# Patient Record
Sex: Male | Born: 1940 | Race: White | Hispanic: No | Smoking: Never smoker
Health system: Southern US, Community
[De-identification: ages and names within clinical notes are randomized; demographics above are authoritative.]

## PROBLEM LIST (undated history)

## (undated) DIAGNOSIS — I10 Essential (primary) hypertension: Secondary | ICD-10-CM

## (undated) DIAGNOSIS — M109 Gout, unspecified: Secondary | ICD-10-CM

## (undated) DIAGNOSIS — F418 Other specified anxiety disorders: Secondary | ICD-10-CM

## (undated) DIAGNOSIS — K219 Gastro-esophageal reflux disease without esophagitis: Secondary | ICD-10-CM

## (undated) DIAGNOSIS — C4490 Unspecified malignant neoplasm of skin, unspecified: Secondary | ICD-10-CM

## (undated) DIAGNOSIS — I2510 Atherosclerotic heart disease of native coronary artery without angina pectoris: Secondary | ICD-10-CM

## (undated) DIAGNOSIS — E7800 Pure hypercholesterolemia, unspecified: Secondary | ICD-10-CM

## (undated) DIAGNOSIS — R339 Retention of urine, unspecified: Secondary | ICD-10-CM

## (undated) HISTORY — PX: SKIN CANCER EXCISION: SHX779

## (undated) HISTORY — PX: CATARACT EXTRACTION: SUR2

## (undated) HISTORY — PX: LAPAROSCOPIC CHOLECYSTECTOMY: SUR755

---

## 2014-11-03 ENCOUNTER — Emergency Department (HOSPITAL_BASED_OUTPATIENT_CLINIC_OR_DEPARTMENT_OTHER): Payer: Medicare Other

## 2014-11-03 ENCOUNTER — Inpatient Hospital Stay (HOSPITAL_BASED_OUTPATIENT_CLINIC_OR_DEPARTMENT_OTHER)
Admission: EM | Admit: 2014-11-03 | Discharge: 2014-11-05 | DRG: 378 | Disposition: A | Discharge status: Home / Self Care | Payer: Medicare Other | Attending: Internal Medicine | Admitting: Internal Medicine

## 2014-11-03 ENCOUNTER — Encounter (HOSPITAL_BASED_OUTPATIENT_CLINIC_OR_DEPARTMENT_OTHER): Payer: Self-pay

## 2014-11-03 DIAGNOSIS — Z6824 Body mass index (BMI) 24.0-24.9, adult: Secondary | ICD-10-CM

## 2014-11-03 DIAGNOSIS — B952 Enterococcus as the cause of diseases classified elsewhere: Secondary | ICD-10-CM | POA: Diagnosis present

## 2014-11-03 DIAGNOSIS — N184 Chronic kidney disease, stage 4 (severe): Secondary | ICD-10-CM | POA: Diagnosis present

## 2014-11-03 DIAGNOSIS — K2970 Gastritis, unspecified, without bleeding: Secondary | ICD-10-CM | POA: Diagnosis present

## 2014-11-03 DIAGNOSIS — I2510 Atherosclerotic heart disease of native coronary artery without angina pectoris: Secondary | ICD-10-CM | POA: Diagnosis present

## 2014-11-03 DIAGNOSIS — D649 Anemia, unspecified: Secondary | ICD-10-CM | POA: Diagnosis present

## 2014-11-03 DIAGNOSIS — Z85828 Personal history of other malignant neoplasm of skin: Secondary | ICD-10-CM

## 2014-11-03 DIAGNOSIS — M109 Gout, unspecified: Secondary | ICD-10-CM | POA: Diagnosis present

## 2014-11-03 DIAGNOSIS — Z791 Long term (current) use of non-steroidal anti-inflammatories (NSAID): Secondary | ICD-10-CM

## 2014-11-03 DIAGNOSIS — N390 Urinary tract infection, site not specified: Secondary | ICD-10-CM | POA: Diagnosis present

## 2014-11-03 DIAGNOSIS — R32 Unspecified urinary incontinence: Secondary | ICD-10-CM | POA: Diagnosis present

## 2014-11-03 DIAGNOSIS — K921 Melena: Secondary | ICD-10-CM | POA: Diagnosis present

## 2014-11-03 DIAGNOSIS — N179 Acute kidney failure, unspecified: Secondary | ICD-10-CM | POA: Diagnosis present

## 2014-11-03 DIAGNOSIS — K254 Chronic or unspecified gastric ulcer with hemorrhage: Principal | ICD-10-CM | POA: Diagnosis present

## 2014-11-03 DIAGNOSIS — M545 Low back pain: Secondary | ICD-10-CM | POA: Diagnosis present

## 2014-11-03 DIAGNOSIS — F418 Other specified anxiety disorders: Secondary | ICD-10-CM | POA: Diagnosis present

## 2014-11-03 DIAGNOSIS — Z79899 Other long term (current) drug therapy: Secondary | ICD-10-CM | POA: Diagnosis not present

## 2014-11-03 DIAGNOSIS — K219 Gastro-esophageal reflux disease without esophagitis: Secondary | ICD-10-CM | POA: Diagnosis present

## 2014-11-03 DIAGNOSIS — M419 Scoliosis, unspecified: Secondary | ICD-10-CM | POA: Diagnosis present

## 2014-11-03 DIAGNOSIS — I129 Hypertensive chronic kidney disease with stage 1 through stage 4 chronic kidney disease, or unspecified chronic kidney disease: Secondary | ICD-10-CM | POA: Diagnosis present

## 2014-11-03 DIAGNOSIS — R339 Retention of urine, unspecified: Secondary | ICD-10-CM | POA: Diagnosis present

## 2014-11-03 DIAGNOSIS — K922 Gastrointestinal hemorrhage, unspecified: Secondary | ICD-10-CM

## 2014-11-03 DIAGNOSIS — M1990 Unspecified osteoarthritis, unspecified site: Secondary | ICD-10-CM | POA: Diagnosis present

## 2014-11-03 DIAGNOSIS — F0390 Unspecified dementia without behavioral disturbance: Secondary | ICD-10-CM | POA: Diagnosis present

## 2014-11-03 DIAGNOSIS — E780 Pure hypercholesterolemia: Secondary | ICD-10-CM | POA: Diagnosis present

## 2014-11-03 DIAGNOSIS — K5710 Diverticulosis of small intestine without perforation or abscess without bleeding: Secondary | ICD-10-CM | POA: Diagnosis present

## 2014-11-03 DIAGNOSIS — D62 Acute posthemorrhagic anemia: Secondary | ICD-10-CM | POA: Diagnosis not present

## 2014-11-03 DIAGNOSIS — R634 Abnormal weight loss: Secondary | ICD-10-CM | POA: Diagnosis present

## 2014-11-03 DIAGNOSIS — B9620 Unspecified Escherichia coli [E. coli] as the cause of diseases classified elsewhere: Secondary | ICD-10-CM | POA: Diagnosis present

## 2014-11-03 DIAGNOSIS — K5900 Constipation, unspecified: Secondary | ICD-10-CM | POA: Diagnosis present

## 2014-11-03 HISTORY — DX: Gastro-esophageal reflux disease without esophagitis: K21.9

## 2014-11-03 HISTORY — DX: Unspecified malignant neoplasm of skin, unspecified: C44.90

## 2014-11-03 HISTORY — DX: Other specified anxiety disorders: F41.8

## 2014-11-03 HISTORY — DX: Atherosclerotic heart disease of native coronary artery without angina pectoris: I25.10

## 2014-11-03 HISTORY — DX: Pure hypercholesterolemia, unspecified: E78.00

## 2014-11-03 HISTORY — DX: Gout, unspecified: M10.9

## 2014-11-03 HISTORY — DX: Retention of urine, unspecified: R33.9

## 2014-11-03 HISTORY — DX: Essential (primary) hypertension: I10

## 2014-11-03 LAB — CBC WITH DIFFERENTIAL/PLATELET
BAND NEUTROPHILS: 0 %
BASOS ABS: 0 10*3/uL (ref 0.0–0.1)
BLASTS: 0 %
Basophils Relative: 0 %
Eosinophils Absolute: 0.4 10*3/uL (ref 0.0–0.7)
Eosinophils Relative: 4 %
HEMATOCRIT: 38.7 % — AB (ref 39.0–52.0)
HEMOGLOBIN: 12.2 g/dL — AB (ref 13.0–17.0)
Lymphocytes Relative: 26 %
Lymphs Abs: 2.3 10*3/uL (ref 0.7–4.0)
MCH: 28.8 pg (ref 26.0–34.0)
MCHC: 31.5 g/dL (ref 30.0–36.0)
MCV: 91.5 fL (ref 78.0–100.0)
METAMYELOCYTES PCT: 1 %
Monocytes Absolute: 0.5 10*3/uL (ref 0.1–1.0)
Monocytes Relative: 6 %
Myelocytes: 0 %
Neutro Abs: 5.7 10*3/uL (ref 1.7–7.7)
Neutrophils Relative %: 63 %
PLATELETS: 241 10*3/uL (ref 150–400)
PROMYELOCYTES ABS: 0 %
RBC: 4.23 MIL/uL (ref 4.22–5.81)
RDW: 14.4 % (ref 11.5–15.5)
WBC: 8.9 10*3/uL (ref 4.0–10.5)
nRBC: 0 /100 WBC

## 2014-11-03 LAB — BASIC METABOLIC PANEL
ANION GAP: 10 (ref 5–15)
BUN: 44 mg/dL — AB (ref 6–20)
CALCIUM: 10 mg/dL (ref 8.9–10.3)
CO2: 26 mmol/L (ref 22–32)
Chloride: 105 mmol/L (ref 101–111)
Creatinine, Ser: 2.1 mg/dL — ABNORMAL HIGH (ref 0.61–1.24)
GFR calc Af Amer: 34 mL/min — ABNORMAL LOW (ref >60)
GFR, EST NON AFRICAN AMERICAN: 29 mL/min — AB (ref >60)
GLUCOSE: 101 mg/dL — AB (ref 65–99)
Potassium: 3.8 mmol/L (ref 3.5–5.1)
SODIUM: 141 mmol/L (ref 135–145)

## 2014-11-03 LAB — URINALYSIS, ROUTINE W REFLEX MICROSCOPIC
Bilirubin Urine: NEGATIVE
Glucose, UA: NEGATIVE mg/dL
KETONES UR: NEGATIVE mg/dL
Nitrite: NEGATIVE
PROTEIN: NEGATIVE mg/dL
Specific Gravity, Urine: 1.016 (ref 1.005–1.030)
UROBILINOGEN UA: 1 mg/dL (ref 0.0–1.0)
pH: 6 (ref 5.0–8.0)

## 2014-11-03 LAB — URINE MICROSCOPIC-ADD ON

## 2014-11-03 LAB — OCCULT BLOOD X 1 CARD TO LAB, STOOL: Fecal Occult Bld: POSITIVE — AB

## 2014-11-03 MED ORDER — DEXTROSE 5 % IV SOLN
1.0000 g | Freq: Once | INTRAVENOUS | Status: AC
  Administered 2014-11-03: 1 g via INTRAVENOUS

## 2014-11-03 MED ORDER — ATORVASTATIN CALCIUM 80 MG PO TABS
80.0000 mg | ORAL_TABLET | Freq: Every day | ORAL | Status: DC
  Administered 2014-11-04 – 2014-11-05 (×2): 80 mg via ORAL
  Filled 2014-11-03 (×2): qty 1

## 2014-11-03 MED ORDER — SODIUM CHLORIDE 0.9 % IV BOLUS (SEPSIS)
1000.0000 mL | Freq: Once | INTRAVENOUS | Status: AC
  Administered 2014-11-03: 1000 mL via INTRAVENOUS

## 2014-11-03 MED ORDER — PANTOPRAZOLE SODIUM 40 MG IV SOLR
40.0000 mg | INTRAVENOUS | Status: DC
  Administered 2014-11-03 – 2014-11-04 (×2): 40 mg via INTRAVENOUS
  Filled 2014-11-03 (×2): qty 40

## 2014-11-03 MED ORDER — SODIUM CHLORIDE 0.9 % IV SOLN
INTRAVENOUS | Status: DC
  Administered 2014-11-03: 75 mL/h via INTRAVENOUS
  Administered 2014-11-04 – 2014-11-05 (×2): via INTRAVENOUS

## 2014-11-03 MED ORDER — CEFTRIAXONE SODIUM 1 G IJ SOLR
INTRAMUSCULAR | Status: AC
  Filled 2014-11-03: qty 10

## 2014-11-03 MED ORDER — DEXTROSE 5 % IV SOLN
1.0000 g | INTRAVENOUS | Status: DC
  Administered 2014-11-04: 1 g via INTRAVENOUS
  Filled 2014-11-03 (×4): qty 10

## 2014-11-03 NOTE — ED Notes (Signed)
Pt transported to [facility] via [facility]

## 2014-11-03 NOTE — ED Notes (Signed)
Pt is calling brother in law to get medication list

## 2014-11-03 NOTE — ED Notes (Signed)
Pt last dr appt is unkown to patient, per patient, pt states he has been unable to walk without assistance x 1 year,  Reports blood in stool x 1 week, last colonoscopy x 2 years,

## 2014-11-03 NOTE — ED Notes (Signed)
Pt is poor historian-does not know list of meds or medical hx-states he goes to the [place]

## 2014-11-03 NOTE — ED Notes (Signed)
Patient transported to X-ray 

## 2014-11-03 NOTE — ED Notes (Signed)
Pt also self catheterizes hiself to relieve bladder,

## 2014-11-03 NOTE — ED Provider Notes (Signed)
CSN: [id number]     Arrival date & time 11/03/14  1336 History   First MD Initiated Contact with Patient 11/03/14 1506     Chief Complaint  Patient presents with  . Back Pain     Patient is a 74 y.o. male presenting with back pain. The history is provided by the patient.  Back Pain Location:  Lumbar spine Quality:  Aching Pain severity:  Moderate Onset quality:  Gradual Duration: "awhile" Timing:  Constant Progression:  Worsening Chronicity:  Chronic Relieved by:  Bed rest Worsened by:  Movement Associated symptoms: weakness   pt presents for multiple complaints He reports back pain for months and bilateral LE weakness This is not new today He uses walker at home for several months due to poor mobility  He also reports dark stools, unclear etiology (no pepto bismol reported)  He reports brief episodes of epigastric pain but none at this time  Also - he must self cath for urine for "awhile" per patient.  [facility]    Past Medical History  Diagnosis Date  . Skin cancer   . GERD (gastroesophageal reflux disease)   . Gout   . HTN (hypertension)   . Coronary artery disease   . Hypercholesterolemia   . Self-catheterizes urinary bladder   . Skin cancer   . Anxiety   . Depression    Past Surgical History  Procedure Laterality Date  . Skin cancer excision     No family history on file. Social History  Substance Use Topics  . Smoking status: Never Smoker   . Smokeless tobacco: None  . Alcohol Use: No    Review of Systems  Constitutional: Positive for fatigue.  Respiratory: Negative for shortness of breath.   Musculoskeletal: Positive for back pain.  Neurological: Positive for weakness.  All other systems reviewed and are negative.     Allergies  Review of patient's allergies indicates no known allergies.  Home Medications   Prior to Admission medications   Medication Sig Start Date End Date Taking? Authorizing Provider  allopurinol (ZYLOPRIM)  100 MG tablet Take 100 mg by mouth daily.   Yes Historical Provider, MD  atorvastatin (LIPITOR) 80 MG tablet Take 80 mg by mouth daily.   Yes Historical Provider, MD  benazepril-hydrochlorthiazide (LOTENSIN HCT) 10-12.5 MG per tablet Take 1 tablet by mouth daily.   Yes Historical Provider, MD  diazepam (VALIUM) 5 MG tablet Take 5 mg by mouth every 6 (six) hours as needed for anxiety.   Yes Historical Provider, MD  fenofibrate (TRICOR) 48 MG tablet Take 48 mg by mouth daily.   Yes Historical Provider, MD  ibuprofen (ADVIL,MOTRIN) 200 MG tablet Take 200 mg by mouth every 6 (six) hours as needed.   Yes Historical Provider, MD  Meloxicam 10 MG CAPS Take 50 mg by mouth.   Yes Historical Provider, MD  traZODone (DESYREL) 25 mg TABS tablet Take 50 mg by mouth at bedtime.   Yes Historical Provider, MD   BP 145/67 mmHg  Pulse 56  Temp(Src) 97.9 F (36.6 C) (Oral)  Resp 19  Ht 5\' 9"  (1.753 m)  Wt 163 lb (73.936 kg)  BMI 24.06 kg/m2  SpO2 96% Physical Exam CONSTITUTIONAL: Well developed/well nourished HEAD: Normocephalic/atraumatic EYES: EOMI/PERRL ENMT: Mucous membranes moist NECK: supple no meningeal signs SPINE/BACK:mild tenderness to lumbar spine, no bruising or stepoffs noted CV: S1/S2 noted, no murmurs/rubs/gallops noted LUNGS: Lungs are clear to auscultation bilaterally, no apparent distress ABDOMEN: soft, nontender, no  rebound or guarding, bowel sounds noted throughout abdomen Rectal - rectal tone appropriate, stool is dark, hemoccult positive, chaperone present GU:no cva tenderness NEURO: Pt is awake/alert/appropriate, moves all extremitiesx4.  No facial droop.  He has 4/5 strength with bilateral hip flexion EXTREMITIES: pulses normal/equal, full ROM SKIN: warm, color normal PSYCH: no abnormalities of mood noted, alert and oriented to situation  ED Course  Procedures  4:16 PM  Pt poor historian Pt here for multiple complaints including BP/weakness for months as well as dark  stools Labs/imaging ordered 4:54 PM Pt with multiple issues: appears to have early GI bleed, UTI and dehydration He is also feeling weaker and having mobility issues I feel he would benefit from admission Pt/family agreeable His biggest concerned today was darkened stool which was noted on exam  6:00 PM D/w dr Dyann Kief Will admit to [facility] Pt stable at this time  Labs Review Labs Reviewed  URINALYSIS, [place] (NOT AT [facility]) - Abnormal; Notable for the following:    APPearance TURBID (*)    Hgb urine dipstick SMALL (*)    Leukocytes, UA LARGE (*)    All other components within normal limits  BASIC METABOLIC PANEL - Abnormal; Notable for the following:    Glucose, Bld 101 (*)    BUN 44 (*)    Creatinine, Ser 2.10 (*)    GFR calc non Af Amer 29 (*)    GFR calc Af Amer 34 (*)    All other components within normal limits  CBC WITH DIFFERENTIAL/PLATELET - Abnormal; Notable for the following:    Hemoglobin 12.2 (*)    HCT 38.7 (*)    All other components within normal limits  OCCULT BLOOD X 1 CARD TO LAB, STOOL - Abnormal; Notable for the following:    Fecal Occult Bld POSITIVE (*)    All other components within normal limits  URINE MICROSCOPIC-ADD ON - Abnormal; Notable for the following:    Bacteria, UA MANY (*)    All other components within normal limits  URINE CULTURE    Imaging Review Dg Lumbar Spine Complete  11/03/2014   CLINICAL DATA:  Lumbago with radicular symptoms bilaterally. No recent trauma.  EXAM: LUMBAR SPINE - COMPLETE 4+ VIEW  COMPARISON:  None.  FINDINGS: Frontal, lateral, spot lumbosacral lateral, and bilateral oblique views were obtained. There are 5 non-rib-bearing lumbar type vertebral bodies. There is lower lumbar levoscoliosis. There is no fracture or spondylolisthesis. There is moderately severe disc space narrowing at L4-5. There is slightly less narrowing at T12-L1, L3-4, and L5-S1. There is facet osteoarthritic change at L3-4,  L4-5, and L5-S1 bilaterally. There is atherosclerotic calcification in the aorta.  IMPRESSION: Multilevel osteoarthritic change. Scoliosis. No fracture or spondylolisthesis.   Electronically Signed   By: Lowella Grip III M.D.   On: 11/03/2014 16:15   I have personally reviewed and evaluated these images and lab results as part of my medical decision-making.   EKG Interpretation   Date/Time:  Thursday November 03 2014 15:23:32 EDT Ventricular Rate:  47 PR Interval:  212 QRS Duration: 106 QT Interval:  446 QTC Calculation: 394 R Axis:   -9 Text Interpretation:  Sinus bradycardia with 1st degree A-V block  Incomplete right bundle branch block Cannot rule out Anterior infarct ,  age undetermined Abnormal ECG No previous ECGs available Confirmed by  Christy Gentles  MD, Brooklynne Pereida ([id number]) on 11/03/2014 3:32:36 PM     Medications  cefTRIAXone (ROCEPHIN) 1 G injection (not administered)  pantoprazole (  PROTONIX) injection 40 mg (not administered)  0.9 %  sodium chloride infusion (not administered)  cefTRIAXone (ROCEPHIN) 1 g in dextrose 5 % 50 mL IVPB (1 g Intravenous New Bag/Given 11/03/14 1703)  sodium chloride 0.9 % bolus 1,000 mL (1,000 mLs Intravenous New Bag/Given 11/03/14 1702)    MDM   Final diagnoses:  UTI (lower urinary tract infection)  Gastrointestinal hemorrhage, unspecified gastritis, unspecified gastrointestinal hemorrhage type  AKI (acute kidney injury)    Nursing notes including past medical history and social history reviewed and considered in documentation Labs/vital reviewed myself and considered during evaluation xrays/imaging reviewed by myself and considered during evaluation     [facility] Fraise, MD 11/03/14 1800

## 2014-11-03 NOTE — Progress Notes (Signed)
Patient with general weakness, black stools, positive FOBT and UA suggesting UTI. No fever, no SOB and no CP. Patient also found with AKI (unkown if acute or acute on chronic). Patient hemodynamically stable and with normal electrolytes.  Urine cx asked to be taken, rocephin initiated and started on IVF's. IV protonix, type and screen, 2 large IV's and CLD ordered. Patient accepted to Med surg.  Hector Finley [id number]

## 2014-11-03 NOTE — Progress Notes (Signed)
Pt arrived from [facility] ER in [place] via stretcher by [facility]. Pt alert and oriented x4. Denies any pain. Oriented to room. Call light within reach. Will continue to monitor.

## 2014-11-03 NOTE — ED Notes (Signed)
C/o lower back pain, decreased mobility to bilat LE x 4-5 months-also c/o dark stools x 4-5 months-states he has not been seen by doctor-pt's son brought pt in-states he has been walker at home x 3 months per Dr Sherral Hammers at [facility]

## 2014-11-04 ENCOUNTER — Inpatient Hospital Stay (HOSPITAL_COMMUNITY): Payer: Medicare Other | Admitting: Anesthesiology

## 2014-11-04 ENCOUNTER — Encounter (HOSPITAL_COMMUNITY): Payer: Self-pay | Admitting: Physician Assistant

## 2014-11-04 ENCOUNTER — Encounter (HOSPITAL_COMMUNITY)
Admission: EM | Disposition: A | Discharge status: Home / Self Care | Payer: Self-pay | Source: Home / Self Care | Attending: Internal Medicine

## 2014-11-04 DIAGNOSIS — N390 Urinary tract infection, site not specified: Secondary | ICD-10-CM

## 2014-11-04 DIAGNOSIS — N184 Chronic kidney disease, stage 4 (severe): Secondary | ICD-10-CM

## 2014-11-04 DIAGNOSIS — N179 Acute kidney failure, unspecified: Secondary | ICD-10-CM

## 2014-11-04 DIAGNOSIS — D62 Acute posthemorrhagic anemia: Secondary | ICD-10-CM

## 2014-11-04 HISTORY — PX: ESOPHAGOGASTRODUODENOSCOPY: SHX5428

## 2014-11-04 LAB — CBC
HEMATOCRIT: 33.4 % — AB (ref 39.0–52.0)
Hemoglobin: 10.8 g/dL — ABNORMAL LOW (ref 13.0–17.0)
MCH: 29.3 pg (ref 26.0–34.0)
MCHC: 32.3 g/dL (ref 30.0–36.0)
MCV: 90.8 fL (ref 78.0–100.0)
Platelets: 210 10*3/uL (ref 150–400)
RBC: 3.68 MIL/uL — ABNORMAL LOW (ref 4.22–5.81)
RDW: 14.4 % (ref 11.5–15.5)
WBC: 9.5 10*3/uL (ref 4.0–10.5)

## 2014-11-04 LAB — SODIUM, URINE, RANDOM: Sodium, Ur: 142 mmol/L

## 2014-11-04 LAB — BASIC METABOLIC PANEL
Anion gap: 7 (ref 5–15)
BUN: 34 mg/dL — AB (ref 6–20)
CALCIUM: 9.1 mg/dL (ref 8.9–10.3)
CO2: 26 mmol/L (ref 22–32)
Chloride: 108 mmol/L (ref 101–111)
Creatinine, Ser: 1.74 mg/dL — ABNORMAL HIGH (ref 0.61–1.24)
GFR calc Af Amer: 42 mL/min — ABNORMAL LOW (ref >60)
GFR, EST NON AFRICAN AMERICAN: 37 mL/min — AB (ref >60)
GLUCOSE: 101 mg/dL — AB (ref 65–99)
POTASSIUM: 3.7 mmol/L (ref 3.5–5.1)
Sodium: 141 mmol/L (ref 135–145)

## 2014-11-04 LAB — HEMOGLOBIN AND HEMATOCRIT, BLOOD
HEMATOCRIT: 38.4 % — AB (ref 39.0–52.0)
HEMATOCRIT: 34.5 % — AB (ref 39.0–52.0)
HEMOGLOBIN: 12.4 g/dL — AB (ref 13.0–17.0)
HEMOGLOBIN: 11.1 g/dL — AB (ref 13.0–17.0)

## 2014-11-04 LAB — CREATININE, URINE, RANDOM: CREATININE, URINE: 108.85 mg/dL

## 2014-11-04 SURGERY — EGD (ESOPHAGOGASTRODUODENOSCOPY)
Anesthesia: Monitor Anesthesia Care

## 2014-11-04 MED ORDER — BOOST / RESOURCE BREEZE PO LIQD
1.0000 | Freq: Three times a day (TID) | ORAL | Status: DC
  Administered 2014-11-04 – 2014-11-05 (×3): 1 via ORAL
  Filled 2014-11-04 (×7): qty 1

## 2014-11-04 MED ORDER — ENSURE ENLIVE PO LIQD
237.0000 mL | Freq: Two times a day (BID) | ORAL | Status: DC
  Administered 2014-11-05: 237 mL via ORAL
  Filled 2014-11-04 (×4): qty 237

## 2014-11-04 MED ORDER — DOCUSATE SODIUM 100 MG PO CAPS
100.0000 mg | ORAL_CAPSULE | Freq: Two times a day (BID) | ORAL | Status: DC
  Administered 2014-11-04 – 2014-11-05 (×3): 100 mg via ORAL
  Filled 2014-11-04 (×3): qty 1

## 2014-11-04 MED ORDER — SODIUM CHLORIDE 0.9 % IV SOLN: INTRAVENOUS | Status: DC

## 2014-11-04 MED ORDER — LACTATED RINGERS IV SOLN
INTRAVENOUS | Status: DC
  Administered 2014-11-04: 11:00:00 via INTRAVENOUS

## 2014-11-04 MED ORDER — PROPOFOL 10 MG/ML IV BOLUS
INTRAVENOUS | Status: DC | PRN
  Administered 2014-11-04 (×2): 20 mg via INTRAVENOUS

## 2014-11-04 MED ORDER — PROPOFOL INFUSION 10 MG/ML OPTIME
INTRAVENOUS | Status: DC | PRN
  Administered 2014-11-04: 50 ug/kg/min via INTRAVENOUS

## 2014-11-04 MED ORDER — ACETAMINOPHEN 325 MG PO TABS
650.0000 mg | ORAL_TABLET | ORAL | Status: DC | PRN
  Administered 2014-11-05: 650 mg via ORAL
  Filled 2014-11-04: qty 2

## 2014-11-04 NOTE — Care Management Note (Signed)
Case Management Note  Patient Details  Name: Hector Finley MRN: [id number] Date of Birth: 03-20-1940  Subjective/Objective:                    Action/Plan: Pt admitted with UTI/ GI bleed. Pt is from home alone. CM will continue to follow for discharge needs.  Expected Discharge Date:                  Expected Discharge Plan:  Home/Self Care  In-House Referral:     Discharge planning Services     Post Acute Care Choice:    Choice offered to:     DME Arranged:    DME Agency:     HH Arranged:    HH Agency:     Status of Service:  In process, will continue to follow  Medicare Important Message Given:    Date Medicare IM Given:    Medicare IM give by:    Date Additional Medicare IM Given:    Additional Medicare Important Message give by:     If discussed at [place] of Stay Meetings, dates discussed:    Additional Comments:  Ollen Gross, RN 11/04/2014, 4:02 PM

## 2014-11-04 NOTE — Interval H&P Note (Signed)
History and Physical Interval Note:  11/04/2014 11:00 AM  Hector Finley  has presented today for surgery, with the diagnosis of Anemia, FOBT positive  The various methods of treatment have been discussed with the patient and family. After consideration of risks, benefits and other options for treatment, the patient has consented to  Procedure(s): ESOPHAGOGASTRODUODENOSCOPY (EGD) (N/A) as a surgical intervention .  [name] history has been reviewed, patient examined, no change in status, stable for surgery.  I have reviewed the patient's chart and labs.  Questions were answered to the patient's satisfaction.     Renelda Loma Armbruster

## 2014-11-04 NOTE — Progress Notes (Signed)
Pt with history of unable to void. Pt self in and out cath from home. Assisted with in and out cath of 669ml of yellow cloudy urine.

## 2014-11-04 NOTE — Progress Notes (Signed)
Initial Nutrition Assessment  INTERVENTION:  Diet advancement per MD Provide Boost Breeze po TID while on clear liquids, each supplement provides 250 kcal and 9 grams of protein Add Ensure Enlive po TID when diet adv, each supplement provides 350 kcal and 20 grams of protein   NUTRITION DIAGNOSIS:   Inadequate oral intake related to poor appetite as evidenced by per patient/family report, mild depletion of muscle mass, mild depletion of body fat.   GOAL:   Patient will meet greater than or equal to 90% of their needs   MONITOR:   Diet advancement, PO intake, Supplement acceptance, Labs, Weight trends  REASON FOR ASSESSMENT:   Malnutrition Screening Tool    ASSESSMENT:   74 y.o. male with a past medical history significant for suspected dementia, gout, chronic renal failure stage unknown, hypertension and hyperlipidemia who presents with back pain and black stools.  Pt difficult to obtain history from. Pt states that he has not eaten in 2 days but doesn't feel hungry. He reports having a decreased appetite for a few days PTA. He usually eats 1 meal in the morning, nibbles throughout the day, and drinks one protein shake daily, sometimes Ensure. Pt reports weight loss and lower extremity weakness. Per H&P pt used to weigh 184 lbs.  RD encouraged adequate healthful PO intake; encouraged intake of Ensure for skipped meals and poor PO intake. RD provided coupons for Ensure and Boost supplements.  Noted mild fat and muscle wasting per physical exam.   Labs: low hemoglobin, elevated BUN/creatinine, decreased GFR   Diet Order:  Diet clear liquid Room service appropriate?: Yes; Fluid consistency:: Thin  Skin:  Reviewed, no issues  Last BM:  PTA  Height:   Ht Readings from Last 1 Encounters:  11/03/14 5\' 9"  (1.753 m)    Weight:   Wt Readings from Last 1 Encounters:  11/03/14 163 lb (73.936 kg)    Ideal Body Weight:  72.7 kg  BMI:  Body mass index is 24.06  kg/(m^2).  Estimated Nutritional Needs:   Kcal:  1800-2000  Protein:  90-100 grams  Fluid:  1.8-2 L/day  EDUCATION NEEDS:   No education needs identified at this time  [place], LDN Inpatient Clinical Dietitian Pager: [phone] After Hours Pager: [phone]

## 2014-11-04 NOTE — Anesthesia Postprocedure Evaluation (Signed)
  Anesthesia Post-op Note  Patient: Hector Finley  Procedure(s) Performed: Procedure(s) (LRB): ESOPHAGOGASTRODUODENOSCOPY (EGD) (N/A)  Patient Location: PACU  Anesthesia Type: MAC  Level of Consciousness: awake and alert   Airway and Oxygen Therapy: Patient Spontanous Breathing  Post-op Pain: mild  Post-op Assessment: Post-op Vital signs reviewed, Patient's Cardiovascular Status Stable, Respiratory Function Stable, Patent Airway and No signs of Nausea or vomiting  Last Vitals:  Filed Vitals:   11/04/14 1230  BP: 107/39  Pulse: 52  Temp:   Resp: 18    Post-op Vital Signs: stable   Complications: No apparent anesthesia complications

## 2014-11-04 NOTE — Progress Notes (Signed)
Assisted patient with intermittent cath, 650 ml urine output.  Amber in color with a slight odor.  Cloudy in appearance.  Pt tolerated well.  Sterile procedure was used. / Christian Mate Student RN [place]

## 2014-11-04 NOTE — Anesthesia Preprocedure Evaluation (Addendum)
Anesthesia Evaluation  Patient identified by MRN, date of birth, ID band Patient awake    Reviewed: Allergy & Precautions, NPO status , Patient's Chart, lab work & pertinent test results  Airway Mallampati: II  TM Distance: >3 FB Neck ROM: Full    Dental no notable dental hx.    Pulmonary neg pulmonary ROS,    Pulmonary exam normal breath sounds clear to auscultation       Cardiovascular hypertension, Pt. on medications Normal cardiovascular exam Rhythm:Regular Rate:Normal     Neuro/Psych negative neurological ROS  negative psych ROS   GI/Hepatic Neg liver ROS, GERD  ,  Endo/Other  negative endocrine ROS  Renal/GU Renal InsufficiencyRenal diseaseCKD (chronic kidney disease) stage 4, GFR 15-29 ml/min  negative genitourinary   Musculoskeletal negative musculoskeletal ROS (+)   Abdominal   Peds negative pediatric ROS (+)  Hematology  (+) anemia ,   Anesthesia Other Findings   Reproductive/Obstetrics negative OB ROS                            Anesthesia Physical Anesthesia Plan  ASA: III  Anesthesia Plan: MAC   Post-op Pain Management:    Induction: Intravenous  Airway Management Planned: Nasal Cannula  Additional Equipment:   Intra-op Plan:   Post-operative Plan:   Informed Consent: I have reviewed the patients History and Physical, chart, labs and discussed the procedure including the risks, benefits and alternatives for the proposed anesthesia with the patient or authorized representative who has indicated his/her understanding and acceptance.   Dental advisory given  Plan Discussed with: CRNA and Surgeon  Anesthesia Plan Comments:         Anesthesia Quick Evaluation

## 2014-11-04 NOTE — Transfer of Care (Signed)
Immediate Anesthesia Transfer of Care Note  Patient: Hector Finley  Procedure(s) Performed: Procedure(s): ESOPHAGOGASTRODUODENOSCOPY (EGD) (N/A)  Patient Location: PACU and Endoscopy Unit  Anesthesia Type:MAC  Level of Consciousness: awake, alert , oriented and sedated  Airway & Oxygen Therapy: Patient Spontanous Breathing and Patient connected to nasal cannula oxygen  Post-op Assessment: Report given to RN, Post -op Vital signs reviewed and stable and Patient moving all extremities  Post vital signs: Reviewed and stable  Last Vitals:  Filed Vitals:   11/04/14 1224  BP: 109/43  Pulse: 57  Temp: 36.7 C  Resp: 18    Complications: No apparent anesthesia complications

## 2014-11-04 NOTE — H&P (Signed)
History and Physical  Hector Finley [id number] DOB: [birth year] DOA: 11/03/2014  Referring physician: Ripley Fraise, MD PCP: No primary care provider on file.   Chief Complaint: Weakness and black stools  HPI: Hector Finley is a 74 y.o. male with a past medical history significant for suspected dementia, gout, chronic renal failure stage unknown, hypertension and hyperlipidemia who presents with back pain and black stools.  The patient is alone, and is judged to be a questionable historian.  He describes a few weeks of increasing weakness, followed by black hard stools today, that led him to present to [facility].  The patient is chronically constipated, with a stool about every four days.  Today, he gave himself an enema, which he periodically does, and this had small hard black stools, without blood.   In the ED, he had a normal lumbar x-ray, heme-positive stool, and mild anemia.  He was hemodynamically stable and transferred to [facility].  Of note, he takes meloxicam daily for joint pain, and denies any previous history of UGIB.  He does not drink alcohol.   Review of Systems:  Patient seen 2200. Pt complains of mild epigastric pain, dyspepsia, weakness, knee pain, constipation, black stools. Twelve systems were reviewed and were negative except as noted above in the history of present illness.  Past Medical History  Diagnosis Date  . Skin cancer   . GERD (gastroesophageal reflux disease)   . Gout   . HTN (hypertension)   . Coronary artery disease   . Hypercholesterolemia   . Self-catheterizes urinary bladder   . Skin cancer   . Anxiety   . Depression    Past Surgical History  Procedure Laterality Date  . Skin cancer excision     Social History:  reports that he has never smoked. He does not have any smokeless tobacco history on file. He reports that he does not drink alcohol or use illicit drugs. Patient lives with son in [place].  Very remote smoking  history.  He is not independent with any IADLs.  He requires a walker for ambulation.  He appears to be independent with basic ADLs.  No Known Allergies  History reviewed. No pertinent family history.  Father with MI in old age. Sister with unknown cancer. No family history of ulcers.  Prior to Admission medications   Medication Sig Start Date End Date Taking? Authorizing Provider  allopurinol (ZYLOPRIM) 100 MG tablet Take 100 mg by mouth daily.   Yes Historical Provider, MD  atorvastatin (LIPITOR) 80 MG tablet Take 80 mg by mouth daily.   Yes Historical Provider, MD  benazepril-hydrochlorthiazide (LOTENSIN HCT) 10-12.5 MG per tablet Take 1 tablet by mouth daily.    Historical Provider, MD  ibuprofen (ADVIL,MOTRIN) 200 MG tablet Take 200 mg by mouth every 6 (six) hours as needed.    Historical Provider, MD    Physical Exam: BP 142/67 mmHg  Pulse 50  Temp(Src) 97.7 F (36.5 C) (Oral)  Resp 14  Ht 5\' 9"  (1.753 m)  Wt 73.936 kg (163 lb)  BMI 24.06 kg/m2  SpO2 94% General: Healthy, alert and in no distress.  Responds appropriately to questions.   HEENT: Corneas clear, conjunctivae and sclerae normal without injection or icterus, lids and lashes normal.  EOMI and PERRL.  Visual tracking smooth.  OP moist without erythema, exudates, cobblestoning, or ulcers.  No airway deformities.  Neck supple.   Cardiac: Bradycardic, regular, nl S1-S2, no murmurs, rubs, gallops.  Capillary refill is  less than 2 seconds.  No lower extremity swelling.  No JVD. Respiratory: Normal respiratory rate and rhythm.  CTAB without rales or wheezes. Abdomen: BS present.  Mild epigastric TTP, no guarding or rebound.  Abdomen is soft without masses appreciated.  No striae, dilated veins, rashes, or lesions.  No ascites, distension. Extremities: No deformities/injuries.  5/5 grip strength and upper extremity flexion/extension, symmetrically.  Extremities are warm and well-perfused. Neuro: Sensorium intact.  Speech is  fluent.  Naming is grossly intact.  [name] recall, recent and remote, seems impaired.  Muscle tone normal, without fasciculations.  Moves all extremities equally and with normal coordination.   Psych: Appropriate affect.  Normal rate and rhythm of speech.  Thought content appropriate, and thought process very tangential.         Labs on Admission:  Basic Metabolic Panel:  Recent Labs Lab 11/03/14 1540  NA 141  K 3.8  CL 105  CO2 26  GLUCOSE 101*  BUN 44*  CREATININE 2.10*  CALCIUM 10.0   CBC:  Recent Labs Lab 11/03/14 1540  WBC 8.9  NEUTROABS 5.7  HGB 12.2*  HCT 38.7*  MCV 91.5  PLT 241    Radiological Exams on Admission: Dg Lumbar Spine Complete 11/03/2014    IMPRESSION: Multilevel osteoarthritic change. Scoliosis. No fracture or spondylolisthesis.   EKG: Independently reviewed. Sinus bradycardia, no previous for comparison.  No ST changes.  Assessment/Plan Present on Admission:  . UTI (lower urinary tract infection) . AKI (acute kidney injury) . Bleeding gastrointestinal . GI bleed . CKD (chronic kidney disease) stage 4, GFR 15-29 ml/min   1. UGIB: The patient has heme-positive stool.  Denies history of UGIB. - T&S - Pantoprazole 40 mg IV daily - GI consult, appreciate recommendations - Diet clears  2. ?Acute on chronic kidney disease: [name] creatinine 18 months ago (in [facility] was 1.5 mg/dL, but his current baseline is unknown.  Assume AoC KI and: - Fluid resuscitation  - Trend BMP  3. UTI: The patient performs self-catheterization by himself at home, and admits to un-hygienic technique.  His urinalysis suggests infection and urine culture is pending.  He does not endorse new pain with catheterization or hematuria. - cefriaxone 1g daily for UTI - Follow urine culture  4. Chronic medical issues: - Continue atorvastatin     DVT PPx: SCDs given bleed risk  Diet: Clears  Consultants: GI  Code Status:  Full  Disposition Plan:  The appropriate admission status for this patient is INPATIENT. Inpatient status is judged to be reasonable and necessary in order to provide the required intensity of service to ensure the patient's safety. [name] presenting symptoms, physical exam findings, and initial radiographic and laboratory data in the context of their chronic comorbidities is felt to place them at high risk for further clinical deterioration. Furthermore, it is not anticipated that the patient will be medically stable for discharge from the hospital within 2 midnights of admission. The following factors support the admission status of inpatient.   A. [name] presenting symptoms include melena and weakness. B. The worrisome physical exam findings include heme-positive stool. C. The initial radiographic and laboratory data are worrisome because of possible renal failure (not clear at this time) and suspected UTI. D. The chronic co-morbidities include dementia and hyperlipidemia and incontinence. E. Patient requires inpatient status due to high intensity of service, high risk for further deterioration and high frequency of surveillance required. F. I certify that at the point of admission it is  my clinical judgment that the patient will require inpatient hospital care spanning beyond 2 midnights from the point of admission.     Edwin Dada [facility] Pager [phone]

## 2014-11-04 NOTE — Progress Notes (Signed)
[facility]                                                                              Patient Demographics  Hector Finley, is a 74 y.o. male, DOB - 08-16-1940, [id number]  Admit date - 11/03/2014   Admitting Physician Edwin Dada, MD  Outpatient Primary MD for the patient is No primary care provider on file.  LOS - 1   Chief Complaint  Patient presents with  . Back Pain       Brief HPI   Hector Finley is a 74 y.o. male with a past medical history significant for suspected dementia, gout, chronic renal failure stage unknown, hypertension and hyperlipidemia who presented with back pain and black stools. Patient described describes few weeks of increasing weakness, followed by black hard stools today, that led him to present to [facility]. The patient is chronically constipated, with a stool about every four days.On the day of admission he gave himself an enema which he periodically does and had small hard black stools without any fresh blood. In the ED, he had a normal lumbar x-ray, heme-positive stool, and mild anemia. He was hemodynamically stable and transferred to [facility]. Of note, he takes meloxicam daily for joint pain, and denies any previous history of UGIB. He does not drink alcohol. The patient also reported loss of appetite for about 4-5 days and stated that normally his weight is 184 lbs and was 163 lbs on admission. Patient was admitted for further workup.     Assessment & Plan    Principal Problem:   GI bleed likely upper in the setting of NSAID use daily, FOBT positive - Continue gentle hydration, clear liquid diet, PPI - Gastroenterology consulted, recommending a EGD - Patient had a negative colonoscopy via the [facility], [place] 2 years ago and was normal per the patient  Active Problems:   UTI (lower urinary tract infection) ? Pyuria - Currently patient on empiric Rocephin, he self catheterizes. Will await urine  culture results    AKI (acute kidney injury),  CKD (chronic kidney disease) stage 4, GFR 15-29 ml/min - Baseline creatinine function unknown, presented with creatinine of 2.1 - Continue gentle hydration, hold NSAIDs, ACE inhibitor, HCTZ  Hypertension - Currently stable, hold benazepril, HCTZ due to acute renal insufficiency  Hyperlipidemia Continue statin  Osteoarthritis/ DJD - will give prescription for tramadol at the time of discharge. No NSAIDS  Code Status: Full code  Family Communication: Discussed in detail with the patient, all imaging results, lab results explained to the patient   Disposition Plan: Likely in a.m.  Time Spent in minutes  23minutes  Procedures  Endoscopy today  Consults   Gastroenterology  DVT Prophylaxis  SCD's  Medications  Scheduled Meds: . atorvastatin  80 mg Oral Daily  . cefTRIAXone (ROCEPHIN) IVPB 1 gram/50 mL D5W  1 g Intravenous Q24H  . docusate sodium  100 mg Oral BID  . pantoprazole (PROTONIX) IV  40 mg Intravenous Q24H   Continuous Infusions: . sodium chloride 75 mL/hr (11/03/14 1933)   PRN Meds:.acetaminophen   Antibiotics   Anti-infectives  Start     Dose/Rate Route Frequency Ordered Stop   11/04/14 1600  cefTRIAXone (ROCEPHIN) 1 g in dextrose 5 % 50 mL IVPB     1 g 100 mL/hr over 30 Minutes Intravenous Every 24 hours 11/03/14 2151     11/03/14 1621  cefTRIAXone (ROCEPHIN) 1 G injection    Comments:  Burns, Amy   : cabinet override      11/03/14 1621 11/03/14 1855   11/03/14 1615  cefTRIAXone (ROCEPHIN) 1 g in dextrose 5 % 50 mL IVPB     1 g 100 mL/hr over 30 Minutes Intravenous  Once 11/03/14 1613 11/03/14 1846        Subjective:   Hector Finley was seen and examined today.  Patient denies dizziness, chest pain, shortness of breath, abdominal pain, N/V/D/C, new weakness, numbess, tingling. No acute events overnight.    Objective:   Blood pressure 121/54, pulse 56, temperature 98.2 F (36.8 C), temperature  source Oral, resp. rate 20, height 5\' 9"  (1.753 m), weight 73.936 kg (163 lb), SpO2 96 %.  Wt Readings from Last 3 Encounters:  11/03/14 73.936 kg (163 lb)     Intake/Output Summary (Last 24 hours) at 11/04/14 1039 Last data filed at 11/04/14 1000  Gross per 24 hour  Intake      0 ml  Output   1350 ml  Net  -1350 ml    Exam  General: Alert and oriented x 3, NAD  HEENT:  PERRLA, EOMI, Anicteric Sclera, mucous membranes moist.   Neck: Supple, no JVD, no masses  CVS: S1 S2 auscultated, no rubs, murmurs or gallops. Regular rate and rhythm.  Respiratory: Clear to auscultation bilaterally, no wheezing, rales or rhonchi  Abdomen: Soft, nontender, nondistended, + bowel sounds  Ext: no cyanosis clubbing or edema  Neuro: AAOx3, Cr N's II- XII. Strength 5/5 upper and lower extremities bilaterally  Skin: No rashes  Psych: Normal affect and demeanor, alert and oriented x3    Data Review   Micro Results No results found for this or any previous visit (from the past 240 hour(s)).  Radiology Reports Dg Lumbar Spine Complete  11/03/2014   CLINICAL DATA:  Lumbago with radicular symptoms bilaterally. No recent trauma.  EXAM: LUMBAR SPINE - COMPLETE 4+ VIEW  COMPARISON:  None.  FINDINGS: Frontal, lateral, spot lumbosacral lateral, and bilateral oblique views were obtained. There are 5 non-rib-bearing lumbar type vertebral bodies. There is lower lumbar levoscoliosis. There is no fracture or spondylolisthesis. There is moderately severe disc space narrowing at L4-5. There is slightly less narrowing at T12-L1, L3-4, and L5-S1. There is facet osteoarthritic change at L3-4, L4-5, and L5-S1 bilaterally. There is atherosclerotic calcification in the aorta.  IMPRESSION: Multilevel osteoarthritic change. Scoliosis. No fracture or spondylolisthesis.   Electronically Signed   By: Lowella Grip III M.D.   On: 11/03/2014 16:15    CBC  Recent Labs Lab 11/03/14 1540 11/04/14 0600  WBC 8.9 9.5    HGB 12.2* 10.8*  HCT 38.7* 33.4*  PLT 241 210  MCV 91.5 90.8  MCH 28.8 29.3  MCHC 31.5 32.3  RDW 14.4 14.4  LYMPHSABS 2.3  --   MONOABS 0.5  --   EOSABS 0.4  --   BASOSABS 0.0  --     Chemistries   Recent Labs Lab 11/03/14 1540 11/04/14 0600  NA 141 141  K 3.8 3.7  CL 105 108  CO2 26 26  GLUCOSE 101* 101*  BUN 44* 34*  CREATININE 2.10*  1.74*  CALCIUM 10.0 9.1   ------------------------------------------------------------------------------------------------------------------ estimated creatinine clearance is 36.7 mL/min (by C-G formula based on Cr of 1.74). ------------------------------------------------------------------------------------------------------------------ No results for input(s): HGBA1C in the last 72 hours. ------------------------------------------------------------------------------------------------------------------ No results for input(s): CHOL, HDL, LDLCALC, TRIG, CHOLHDL, LDLDIRECT in the last 72 hours. ------------------------------------------------------------------------------------------------------------------ No results for input(s): TSH, T4TOTAL, T3FREE, THYROIDAB in the last 72 hours.  Invalid input(s): FREET3 ------------------------------------------------------------------------------------------------------------------ No results for input(s): VITAMINB12, FOLATE, FERRITIN, TIBC, IRON, RETICCTPCT in the last 72 hours.  Coagulation profile No results for input(s): INR, PROTIME in the last 168 hours.  No results for input(s): DDIMER in the last 72 hours.  Cardiac Enzymes No results for input(s): CKMB, TROPONINI, MYOGLOBIN in the last 168 hours.  Invalid input(s): CK ------------------------------------------------------------------------------------------------------------------ Invalid input(s): POCBNP  No results for input(s): GLUCAP in the last 72 hours.   [name],RIPUDEEP M.D. [facility] 11/04/2014, 10:39 AM  Pager:  [phone] Between 7am to 7pm - call Pager - [phone]  After 7pm go to [email or web address] - password [email or web address]  Call night coverage person covering after 7pm

## 2014-11-04 NOTE — H&P (View-Only) (Signed)
[facility] Consult: 9:25 AM 11/04/2014  LOS: 1 day    Referring Provider: Dr Tana Coast, [facility] Primary Care Physician:  Care provided by the [facility] Primary Gastroenterologist:  unassigned   Reason for Consultation:  Melena, anemia   HPI: Hector Finley is a 74 y.o. male.  Past medical history of urinary retention requiring him to self catheterize.  Suspected dementia, gout, CKD HTN, HLD, skin cancer excision  Presented to the [facility] med [facility] emergency room complaining of a few weeks of progressive weakness/fatigue. On the day of admission he was having formed, black stools after an enema.  Patient tends to be constipated in general, manages a bowel movement every 4 days. Periodically self administers enemas.  He is taking both ibuprofen and mobile for arthritic pain. Does not drink alcohol.  He reports loss of appetite for about 4 days. Normally his weight is 184#, and it was 163# at admission yesterday.  Hemoglobin at arrival 12.2, down to 10.8 today. MCV in the 90s. Platelets and WBCs normal. FOBT positive BUN of 44/2.1 at arrival. GFR of 34 scores him as stage 3 CKD Urine reveals pyuria, urine culture is pending EKG with sinus tachycardia and first degree AV block Spinal films revealed multilevel osteoarthritis.  Patient has various joint pains and he takes 1 extra strength B CPAP ears daily, Alka-Seltzer up to 2 times daily, Mobic daily. He says he has not used ibuprofen for over one month. I then tried rice getting stuck in his esophagus when he swallows, he doesn't have dysphagia. He rarely has nausea. Sometimes gets upper abdominal upset/pyrosis though he is a little bit vague as to exactly what this is. For at least a year she has intermittently passed formed, black stools. His balance is poor  and he has had frequent falls at home and relies on leaning against furniture or his walker to walk around. Lately just walking about the house causes fatigue. He doesn't move around enough to experience shortness of breath. No chest pain, no palpitations. No dizziness, no fainting   Past Medical History  Diagnosis Date  . Skin cancer   . GERD (gastroesophageal reflux disease)   . Gout   . HTN (hypertension)   . Coronary artery disease   . Hypercholesterolemia   . Urinary retention     Requires him to self catheterize.  . Skin cancer   . Depression with anxiety     Past Surgical History  Procedure Laterality Date  . Skin cancer excision      Prior to Admission medications   Medication Sig Start Date End Date Taking? Authorizing Provider  allopurinol (ZYLOPRIM) 100 MG tablet Take 100 mg by mouth daily.   Yes Historical Provider, MD  atorvastatin (LIPITOR) 80 MG tablet Take 80 mg by mouth daily.   Yes Historical Provider, MD  benazepril-hydrochlorthiazide (LOTENSIN HCT) 10-12.5 MG per tablet Take 1 tablet by mouth daily.    Historical Provider, MD  ibuprofen (ADVIL,MOTRIN) 200 MG tablet Take 200 mg by mouth every 6 (six) hours as needed.  Historical Provider, MD    Scheduled Meds: . atorvastatin  80 mg Oral Daily  . cefTRIAXone (ROCEPHIN) IVPB 1 gram/50 mL D5W  1 g Intravenous Q24H  . docusate sodium  100 mg Oral BID  . pantoprazole (PROTONIX) IV  40 mg Intravenous Q24H   Infusions: . sodium chloride 75 mL/hr (11/03/14 1933)   PRN Meds: acetaminophen   Allergies as of 11/03/2014  . (No Known Allergies)    History reviewed. No pertinent family history.  Social History   Social History  . Marital Status: Legally Separated    Spouse Name: N/A  . Number of Children: N/A  . Years of Education: N/A   Occupational History  . Not on file.   Social History Main Topics  . Smoking status: Never Smoker   . Smokeless tobacco: Not on file  . Alcohol Use: No  . Drug  Use: No  . Sexual Activity: Not on file   Other Topics Concern  . Not on file   Social History Narrative    REVIEW OF SYSTEMS: Constitutional:  Per HPI ENT:  No nose bleeds Pulm:  No SOB or cough CV:  No palpitations, no LE edema.  GU:  No hematuria, no frequency GI:  Per HPI Heme:  Past years did take iron po .  No unusual bleeding or bruising   Transfusions:  None ever Neuro:  No headaches, no peripheral tingling or numbness.  His vision is diminished especially in the right eye where he previously had cataract surgery Derm:  No itching, no Finley or sores. Has upcoming dermatology appointment for follow-up screening Endocrine:  No sweats or chills.  No polyuria or dysuria Immunization:  Did not inquire. Travel:  None beyond local counties in last few months.    PHYSICAL EXAM: Vital signs in last 24 hours: Filed Vitals:   11/04/14 0526  BP: 121/54  Pulse: 56  Temp: 98.2 F (36.8 C)  Resp: 20   Wt Readings from Last 3 Encounters:  11/03/14 163 lb (73.936 kg)    General: Pleasant, healthy-appearing WM who appears his stated age. Comfortable. He is a bit slow to answer questions but seems reliable, admits to why he can't remember. Head:  No facial asymmetry or swelling. No signs of head trauma.  Eyes:  No conjunctival pallor, no scleral icterus Ears:  Not hard of hearing.  Nose:  No congestion or discharge. Mouth:  Moist, pink, clear oral mucosa. Dentition with multiple knobs of black teeth. Neck:  No JVD, no TMG, no masses Lungs:  Clear bilaterally. No cough, no shortness of breath Heart: RRR. No MRG. S1/S2 audible. Abdomen:  Soft, nontender. Not distended. Small incisional scar, well-healed, superior to umbilicus. No masses, no HSM, no bruits. No hernias.   Rectal: Performed, greenish black/brown stool in the rectal vault, FOBT negative. No masses, no tenderness   Musc/Skeltl: No joint erythema, swelling or contracture deformities. Extremities:  No CCE  Neurologic:   Patient is oriented 3. His memory is diminished for previous events going back months but sometimes his memory is quite good. He appears competent to make decisions.  No tremor. No limb weakness. No gross neurologic deficits. Skin:  No telangiectasia, no Finley, no sores. No suspicious skin lesions however. Dermatologic survey not performed Tattoos:  None Nodes:  No cervical or inguinal adenopathy.   Psych:  Pleasant, relaxed, fully engaged  Intake/Output from previous day: 09/15 0701 - 09/16 0700 In: -  Out: 700 [Urine:700] Intake/Output this shift:  LAB RESULTS:  Recent Labs  11/03/14 1540 11/04/14 0600  WBC 8.9 9.5  HGB 12.2* 10.8*  HCT 38.7* 33.4*  PLT 241 210   BMET Lab Results  Component Value Date   NA 141 11/04/2014   NA 141 11/03/2014   K 3.7 11/04/2014   K 3.8 11/03/2014   CL 108 11/04/2014   CL 105 11/03/2014   CO2 26 11/04/2014   CO2 26 11/03/2014   GLUCOSE 101* 11/04/2014   GLUCOSE 101* 11/03/2014   BUN 34* 11/04/2014   BUN 44* 11/03/2014   CREATININE 1.74* 11/04/2014   CREATININE 2.10* 11/03/2014   CALCIUM 9.1 11/04/2014   CALCIUM 10.0 11/03/2014   LFT No results for input(s): PROT, ALBUMIN, AST, ALT, ALKPHOS, BILITOT, BILIDIR, IBILI in the last 72 hours. PT/INR No results found for: INR, PROTIME  Drugs of Abuse  No results found for: LABOPIA, COCAINSCRNUR, LABBENZ, AMPHETMU, THCU, LABBARB   RADIOLOGY STUDIES: Dg Lumbar Spine Complete  11/03/2014   CLINICAL DATA:  Lumbago with radicular symptoms bilaterally. No recent trauma.  EXAM: LUMBAR SPINE - COMPLETE 4+ VIEW  COMPARISON:  None.  FINDINGS: Frontal, lateral, spot lumbosacral lateral, and bilateral oblique views were obtained. There are 5 non-rib-bearing lumbar type vertebral bodies. There is lower lumbar levoscoliosis. There is no fracture or spondylolisthesis. There is moderately severe disc space narrowing at L4-5. There is slightly less narrowing at T12-L1, L3-4, and L5-S1. There is facet  osteoarthritic change at L3-4, L4-5, and L5-S1 bilaterally. There is atherosclerotic calcification in the aorta.  IMPRESSION: Multilevel osteoarthritic change. Scoliosis. No fracture or spondylolisthesis.   Electronically Signed   By: Lowella Grip III M.D.   On: 11/03/2014 16:15    ENDOSCOPIC STUDIES: Per HPI  IMPRESSION:   *  Weight loss, normocytic anemia, FOBT + in pt taking ASA/NSAIDs Pt report [facility] [place] (GI MD/endo center was in [place]) ~ 2 to 3 yrs ago, was advised to have repeat study in 10 yrs  *  Microscopic pyuria, has been started on empiric Rocephin. Urine cultures are pending. In a patient who self catheterizes, not clear that pyuria is representative of a UTI.  *  CKD  *  Multilevel degenerative spine disease/osteoarthritis    PLAN:     *  EGD  *  For the time being we will leave the IV Protonix in place.   Azucena Freed  11/04/2014, 9:25 AM Pager: [phone]  Attending Addendum: I have taken an interval history, reviewed the chart, and examined the patient. I agree with the Advanced Practitioner's note and impression. 74 y/o male presenting with epigastric discomfort and dark stools, in the setting of routine NSAID use (mobic and aspirin daily). Not on any GI prophylaxis. Suspect likely NSAID induced PUD. Will start with EGD to evaluate initially. The indications, risks, and benefits of EGD were explained to the patient in detail. Risks include but not limited to bleeding, perforation, adverse reaction to medications, cardiopulmonary compromise. Patient verbalized understanding and wished to proceed. All questions answered. Further recommendations pending results of the exam.  Trinidad Cellar, MD [facility] Pager [phone]

## 2014-11-04 NOTE — Evaluation (Signed)
Physical Therapy Evaluation Patient Details Name: Hector Finley MRN: [id number] DOB: 1940/03/09 Today's Date: 11/04/2014   History of Present Illness  Hector Finley is a 74 y.o. male. Past medical history of urinary retention requiring him to self catheterize. Suspected dementia, gout, CKD HTN, HLD, skin cancer excision.  He was admitted due to complaining of a few weeks of progressive weakness/fatigue.   Positive for black stools and UTI.  Clinical Impression  Patient presents with decreased independence and safety with mobility due to deficits listed in PT problem list.  He will benefit from skilled PT in the acute setting to allow return home with assist and HHPT.  Patient currently refusing HHPT, but encouraged and feel he may accept if offered and encouraged.  Needs in home practice/repitition of safety techniques to decrease falls at home.    Follow Up Recommendations Home health PT;Supervision for mobility/OOB    Equipment Recommendations  None recommended by PT    Recommendations for Other Services       Precautions / Restrictions Precautions Precautions: Fall Precaution Comments: Reports too many falls in 6 months to count, has been able to crawl and get up on his own Restrictions Weight Bearing Restrictions: No      Mobility  Bed Mobility Overal bed mobility: Modified Independent                Transfers Overall transfer level: Needs assistance   Transfers: Sit to/from Stand Sit to Stand: Supervision;Min guard         General transfer comment: cues due to pulling up on walker, assist to keep walker from tipping backwards, pt braces legs against edge of bed as well for leverage  Ambulation/Gait Ambulation/Gait assistance: Min guard Ambulation Distance (Feet): 150 Feet Assistive device: Rolling walker (2 wheeled) Gait Pattern/deviations: Step-through pattern;Decreased stride length;Trunk flexed;Trendelenburg     General Gait Details: lateral  weakness on left LE, cues, assist to stay inside walker and for upright posture  Stairs            Wheelchair Mobility    Modified Rankin (Stroke Patients Only)       Balance Overall balance assessment: Needs assistance Sitting-balance support: No upper extremity supported Sitting balance-Leahy Scale: Good Sitting balance - Comments: sitting without UE support to don socks, but left LE kept falling after he crossed it over to don sock; so would be at risk to fall off edge of bed if leg falls and pt leans over   Standing balance support: Bilateral upper extremity supported Standing balance-Leahy Scale: Poor Standing balance comment: UE support needed for balance                             Pertinent Vitals/Pain Pain Assessment: 0-10 Pain Score: 4  Pain Location: left knee weakness and pain Pain Descriptors / Indicators: Aching Pain Intervention(s): Monitored during session    Home Living Family/patient expects to be discharged to:: Private residence Living Arrangements: Other relatives Available Help at Discharge: Family;Available PRN/intermittently Type of Home: House Home Access: Stairs to enter Entrance Stairs-Rails: Psychiatric nurse of Steps: 5 Home Layout: One level Home Equipment: Walker - 2 wheels;Cane - single point      Prior Function Level of Independence: Independent with assistive device(s)         Comments: reports progressive weakness up to point unable to ambulate PTA.  States may go stay with his son where he could have more help  at d/c.  Currently staying with brother in law     Hand Dominance        Extremity/Trunk Assessment               Lower Extremity Assessment: RLE deficits/detail;LLE deficits/detail RLE Deficits / Details: AROM WFL, strength hip flexion 4-/5, knee extension 4+/5, ankle DF 4+/5 LLE Deficits / Details: AROM WFL, strength hip flexion 3-/5, knee extension 4-/5, ankle DF 3+/5      Communication   Communication: No difficulties  Cognition Arousal/Alertness: Awake/alert Behavior During Therapy: WFL for tasks assessed/performed Overall Cognitive Status: No family/caregiver present to determine baseline cognitive functioning Area of Impairment: Memory;Safety/judgement;Problem solving     Memory: Decreased short-term memory   Safety/Judgement: Decreased awareness of safety;Decreased awareness of deficits   Problem Solving: Requires verbal cues      General Comments      Exercises        Assessment/Plan    PT Assessment Patient needs continued PT services  PT Diagnosis Abnormality of gait;Generalized weakness   PT Problem List Decreased strength;Decreased knowledge of use of DME;Decreased activity tolerance;Decreased safety awareness;Decreased balance;Decreased knowledge of precautions;Decreased mobility  PT Treatment Interventions DME instruction;Balance training;Gait training;Functional mobility training;Patient/family education;Stair training;Therapeutic activities;Therapeutic exercise   PT Goals (Current goals can be found in the Care Plan section) Acute Rehab PT Goals Patient Stated Goal: To go to son's home PT Goal Formulation: With patient Time For Goal Achievement: 11/18/14 Potential to Achieve Goals: Good    Frequency Min 3X/week   Barriers to discharge Decreased caregiver support currently lives with brother in law with intermittent help from other family, but hopes to d/c to son's home where would have more assist    Co-evaluation               End of Session Equipment Utilized During Treatment: Gait belt Activity Tolerance: Patient limited by fatigue Patient left: in bed;with call bell/phone within reach Nurse Communication: Mobility status         Time: [id number] PT Time Calculation (min) (ACUTE ONLY): 32 min   Charges:   PT Evaluation $Initial PT Evaluation Tier I: 1 Procedure PT Treatments $Gait Training: 8-22  mins   PT G Codes:        Hector Finley 11-23-14, 5:24 PM  Hector Finley, Hector Finley Nov 23, 2014

## 2014-11-04 NOTE — Op Note (Signed)
[facility] [place], [address]   ENDOSCOPY PROCEDURE REPORT  PATIENT: [name], [name]  MR#: [id number] BIRTHDATE: 1940/03/03 , 42  yrs. old GENDER: male ENDOSCOPIST: [facility] Cellar, MD REFERRED BY: PROCEDURE DATE:  11/04/2014 PROCEDURE:  EGD w/ biopsy ASA CLASS:     Class III INDICATIONS:  74 y/o male with significant NSAID use presenting with epigastric pain and melena. MEDICATIONS: Per Anesthesia TOPICAL ANESTHETIC:  DESCRIPTION OF PROCEDURE: After the risks benefits and alternatives of the procedure were thoroughly explained, informed consent was obtained.  The Pentax Gastroscope [id number] endoscope was introduced through the mouth and advanced to the second portion of the duodenum , Without limitations.  The instrument was slowly withdrawn as the mucosa was fully examined.    FINDINGS: The esophagus was normal.  DH, GEJ, and SCJ located 39cm from the incisors.  The stomach was remarkable for a deeply cratered, large ulcer,at least 1cm in size or larger, located superior to the pylorus.  There was a large flat pigmented spot but no focal visible vessel or other high risk stigmata of bleeding. Endoscopic therapy was not performed given no high risk stigmata of bleeding.  The remainder of the stomach had some erythematous gastropathy.  Biopsies were taken of the stomach to rule out H pylori.  The duodenal bulb was normal.  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  Retroflexed views revealed no abnormalities.     The scope was then withdrawn from the patient and the procedure completed.  COMPLICATIONS: There were no immediate complications.      ENDOSCOPIC IMPRESSION: Large deeply cratered ulcer superior to the pylorus with a large flat pigmented spot but no visible vessel or other high risk stigmata of bleeding. Given no high risk stigmata for  bleeding, endoscopic therapy was not performed. Erythematous gastritis, biopsies obtained to rule out H pylori Normal esophagus Large duodenal diverticulum Normal remainder of examined duodenum   RECOMMENDATIONS: Return to medical ward Clear liquid diet okay Continue protonix 40mg  twice daily NO NSAIDS Monitor serial H/H Given the depth to this ulcer, with large pigmented flat spot, while no high risk stigmata of bleeding were noted that warranted endoscopic therapy, would recommend watching him as inpatient today to ensure no evidence of rebleeding Await biopsy results, treat for H pylori if positive After discharge, patient will follow up with me for repeat EGD in the upcoming weeks to ensure appropriate healing and ensure no evidence of gastric malignancy    eSigned:  Jenkins Cellar, MD 11/04/2014 12:30 PM    CC:  PATIENT NAME:  Hector Finley, Hector Finley MR#: [id number]

## 2014-11-04 NOTE — Consult Note (Signed)
[facility] Consult: 9:25 AM 11/04/2014  LOS: 1 day    Referring Provider: Dr Tana Coast, [facility] Primary Care Physician:  Care provided by the [facility] Primary Gastroenterologist:  unassigned   Reason for Consultation:  Melena, anemia   HPI: Hector Finley is a 74 y.o. male.  Past medical history of urinary retention requiring him to self catheterize.  Suspected dementia, gout, CKD HTN, HLD, skin cancer excision  Presented to the [facility] med center emergency room complaining of a few weeks of progressive weakness/fatigue. On the day of admission he was having formed, black stools after an enema.  Patient tends to be constipated in general, manages a bowel movement every 4 days. Periodically self administers enemas.  He is taking both ibuprofen and mobile for arthritic pain. Does not drink alcohol.  He reports loss of appetite for about 4 days. Normally his weight is 184#, and it was 163# at admission yesterday.  Hemoglobin at arrival 12.2, down to 10.8 today. MCV in the 90s. Platelets and WBCs normal. FOBT positive BUN of 44/2.1 at arrival. GFR of 34 scores him as stage 3 CKD Urine reveals pyuria, urine culture is pending EKG with sinus tachycardia and first degree AV block Spinal films revealed multilevel osteoarthritis.  Patient has various joint pains and he takes 1 extra strength B CPAP ears daily, Alka-Seltzer up to 2 times daily, Mobic daily. He says he has not used ibuprofen for over one month. I then tried rice getting stuck in his esophagus when he swallows, he doesn't have dysphagia. He rarely has nausea. Sometimes gets upper abdominal upset/pyrosis though he is a little bit vague as to exactly what this is. For at least a year she has intermittently passed formed, black stools. His balance is poor  and he has had frequent falls at home and relies on leaning against furniture or his walker to walk around. Lately just walking about the house causes fatigue. He doesn't move around enough to experience shortness of breath. No chest pain, no palpitations. No dizziness, no fainting   Past Medical History  Diagnosis Date  . Skin cancer   . GERD (gastroesophageal reflux disease)   . Gout   . HTN (hypertension)   . Coronary artery disease   . Hypercholesterolemia   . Urinary retention     Requires him to self catheterize.  . Skin cancer   . Depression with anxiety     Past Surgical History  Procedure Laterality Date  . Skin cancer excision      Prior to Admission medications   Medication Sig Start Date End Date Taking? Authorizing Provider  allopurinol (ZYLOPRIM) 100 MG tablet Take 100 mg by mouth daily.   Yes Historical Provider, MD  atorvastatin (LIPITOR) 80 MG tablet Take 80 mg by mouth daily.   Yes Historical Provider, MD  benazepril-hydrochlorthiazide (LOTENSIN HCT) 10-12.5 MG per tablet Take 1 tablet by mouth daily.    Historical Provider, MD  ibuprofen (ADVIL,MOTRIN) 200 MG tablet Take 200 mg by mouth every 6 (six) hours as needed.  Historical Provider, MD    Scheduled Meds: . atorvastatin  80 mg Oral Daily  . cefTRIAXone (ROCEPHIN) IVPB 1 gram/50 mL D5W  1 g Intravenous Q24H  . docusate sodium  100 mg Oral BID  . pantoprazole (PROTONIX) IV  40 mg Intravenous Q24H   Infusions: . sodium chloride 75 mL/hr (11/03/14 1933)   PRN Meds: acetaminophen   Allergies as of 11/03/2014  . (No Known Allergies)    History reviewed. No pertinent family history.  Social History   Social History  . Marital Status: Legally Separated    Spouse Name: N/A  . Number of Children: N/A  . Years of Education: N/A   Occupational History  . Not on file.   Social History Main Topics  . Smoking status: Never Smoker   . Smokeless tobacco: Not on file  . Alcohol Use: No  . Drug  Use: No  . Sexual Activity: Not on file   Other Topics Concern  . Not on file   Social History Narrative    REVIEW OF SYSTEMS: Constitutional:  Per HPI ENT:  No nose bleeds Pulm:  No SOB or cough CV:  No palpitations, no LE edema.  GU:  No hematuria, no frequency GI:  Per HPI Heme:  Past years did take iron po .  No unusual bleeding or bruising   Transfusions:  None ever Neuro:  No headaches, no peripheral tingling or numbness.  His vision is diminished especially in the right eye where he previously had cataract surgery Derm:  No itching, no rash or sores. Has upcoming dermatology appointment for follow-up screening Endocrine:  No sweats or chills.  No polyuria or dysuria Immunization:  Did not inquire. Travel:  None beyond local counties in last few months.    PHYSICAL EXAM: Vital signs in last 24 hours: Filed Vitals:   11/04/14 0526  BP: 121/54  Pulse: 56  Temp: 98.2 F (36.8 C)  Resp: 20   Wt Readings from Last 3 Encounters:  11/03/14 163 lb (73.936 kg)    General: Pleasant, healthy-appearing WM who appears his stated age. Comfortable. He is a bit slow to answer questions but seems reliable, admits to why he can't remember. Head:  No facial asymmetry or swelling. No signs of head trauma.  Eyes:  No conjunctival pallor, no scleral icterus Ears:  Not hard of hearing.  Nose:  No congestion or discharge. Mouth:  Moist, pink, clear oral mucosa. Dentition with multiple knobs of black teeth. Neck:  No JVD, no TMG, no masses Lungs:  Clear bilaterally. No cough, no shortness of breath Heart: RRR. No MRG. S1/S2 audible. Abdomen:  Soft, nontender. Not distended. Small incisional scar, well-healed, superior to umbilicus. No masses, no HSM, no bruits. No hernias.   Rectal: Performed, greenish black/brown stool in the rectal vault, FOBT negative. No masses, no tenderness   Musc/Skeltl: No joint erythema, swelling or contracture deformities. Extremities:  No CCE  Neurologic:   Patient is oriented 3. His memory is diminished for previous events going back months but sometimes his memory is quite good. He appears competent to make decisions.  No tremor. No limb weakness. No gross neurologic deficits. Skin:  No telangiectasia, no rash, no sores. No suspicious skin lesions however. Dermatologic survey not performed Tattoos:  None Nodes:  No cervical or inguinal adenopathy.   Psych:  Pleasant, relaxed, fully engaged  Intake/Output from previous day: 09/15 0701 - 09/16 0700 In: -  Out: 700 [Urine:700] Intake/Output this shift:  LAB RESULTS:  Recent Labs  11/03/14 1540 11/04/14 0600  WBC 8.9 9.5  HGB 12.2* 10.8*  HCT 38.7* 33.4*  PLT 241 210   BMET Lab Results  Component Value Date   NA 141 11/04/2014   NA 141 11/03/2014   K 3.7 11/04/2014   K 3.8 11/03/2014   CL 108 11/04/2014   CL 105 11/03/2014   CO2 26 11/04/2014   CO2 26 11/03/2014   GLUCOSE 101* 11/04/2014   GLUCOSE 101* 11/03/2014   BUN 34* 11/04/2014   BUN 44* 11/03/2014   CREATININE 1.74* 11/04/2014   CREATININE 2.10* 11/03/2014   CALCIUM 9.1 11/04/2014   CALCIUM 10.0 11/03/2014   LFT No results for input(s): PROT, ALBUMIN, AST, ALT, ALKPHOS, BILITOT, BILIDIR, IBILI in the last 72 hours. PT/INR No results found for: INR, PROTIME  Drugs of Abuse  No results found for: LABOPIA, COCAINSCRNUR, LABBENZ, AMPHETMU, THCU, LABBARB   RADIOLOGY STUDIES: Dg Lumbar Spine Complete  11/03/2014   CLINICAL DATA:  Lumbago with radicular symptoms bilaterally. No recent trauma.  EXAM: LUMBAR SPINE - COMPLETE 4+ VIEW  COMPARISON:  None.  FINDINGS: Frontal, lateral, spot lumbosacral lateral, and bilateral oblique views were obtained. There are 5 non-rib-bearing lumbar type vertebral bodies. There is lower lumbar levoscoliosis. There is no fracture or spondylolisthesis. There is moderately severe disc space narrowing at L4-5. There is slightly less narrowing at T12-L1, L3-4, and L5-S1. There is facet  osteoarthritic change at L3-4, L4-5, and L5-S1 bilaterally. There is atherosclerotic calcification in the aorta.  IMPRESSION: Multilevel osteoarthritic change. Scoliosis. No fracture or spondylolisthesis.   Electronically Signed   By: Lowella Grip III M.D.   On: 11/03/2014 16:15    ENDOSCOPIC STUDIES: Per HPI  IMPRESSION:   *  Weight loss, normocytic anemia, FOBT + in pt taking ASA/NSAIDs Pt [facility] [place] ([facility] MD/endo center was in [place]) ~ 2 to 3 yrs ago, was advised to have repeat study in 10 yrs  *  Microscopic pyuria, has been started on empiric Rocephin. Urine cultures are pending. In a patient who self catheterizes, not clear that pyuria is representative of a UTI.  *  CKD  *  Multilevel degenerative spine disease/osteoarthritis    PLAN:     *  EGD  *  For the time being we will leave the IV Protonix in place.   Azucena Freed  11/04/2014, 9:25 AM Pager: [phone]  Attending Addendum: I have taken an interval history, reviewed the chart, and examined the patient. I agree with the Advanced Practitioner's note and impression. 74 y/o male presenting with epigastric discomfort and dark stools, in the setting of routine NSAID use (mobic and aspirin daily). Not on any GI prophylaxis. Suspect likely NSAID induced PUD. Will start with EGD to evaluate initially. The indications, risks, and benefits of EGD were explained to the patient in detail. Risks include but not limited to bleeding, perforation, adverse reaction to medications, cardiopulmonary compromise. Patient verbalized understanding and wished to proceed. All questions answered. Further recommendations pending results of the exam.  [place] Cellar, MD [facility] Pager [phone]

## 2014-11-05 DIAGNOSIS — K254 Chronic or unspecified gastric ulcer with hemorrhage: Principal | ICD-10-CM

## 2014-11-05 LAB — HEMOGLOBIN AND HEMATOCRIT, BLOOD
HCT: 41.6 % (ref 39.0–52.0)
HEMOGLOBIN: 13.3 g/dL (ref 13.0–17.0)

## 2014-11-05 LAB — BASIC METABOLIC PANEL
ANION GAP: 10 (ref 5–15)
BUN: 22 mg/dL — ABNORMAL HIGH (ref 6–20)
CALCIUM: 9.5 mg/dL (ref 8.9–10.3)
CO2: 22 mmol/L (ref 22–32)
Chloride: 108 mmol/L (ref 101–111)
Creatinine, Ser: 1.45 mg/dL — ABNORMAL HIGH (ref 0.61–1.24)
GFR calc Af Amer: 53 mL/min — ABNORMAL LOW (ref >60)
GFR, EST NON AFRICAN AMERICAN: 46 mL/min — AB (ref >60)
GLUCOSE: 118 mg/dL — AB (ref 65–99)
POTASSIUM: 3.9 mmol/L (ref 3.5–5.1)
SODIUM: 140 mmol/L (ref 135–145)

## 2014-11-05 MED ORDER — PANTOPRAZOLE SODIUM 40 MG PO TBEC
40.0000 mg | DELAYED_RELEASE_TABLET | Freq: Two times a day (BID) | ORAL | Status: DC
  Administered 2014-11-05: 40 mg via ORAL
  Filled 2014-11-05: qty 1

## 2014-11-05 MED ORDER — CEPHALEXIN 500 MG PO CAPS
500.0000 mg | ORAL_CAPSULE | Freq: Two times a day (BID) | ORAL | Status: DC
  Administered 2014-11-05: 500 mg via ORAL
  Filled 2014-11-05: qty 1

## 2014-11-05 MED ORDER — ATORVASTATIN CALCIUM 40 MG PO TABS: 40.0000 mg | ORAL_TABLET | Freq: Every day | ORAL | Status: DC

## 2014-11-05 MED ORDER — AMLODIPINE BESYLATE 5 MG PO TABS
5.0000 mg | ORAL_TABLET | Freq: Every day | ORAL | Status: DC
  Administered 2014-11-05: 5 mg via ORAL
  Filled 2014-11-05: qty 1

## 2014-11-05 MED ORDER — TRAMADOL HCL 50 MG PO TABS: 50.0000 mg | ORAL_TABLET | Freq: Four times a day (QID) | ORAL | Status: AC | PRN

## 2014-11-05 MED ORDER — CEPHALEXIN 500 MG PO CAPS: 500.0000 mg | ORAL_CAPSULE | Freq: Two times a day (BID) | ORAL | Status: AC

## 2014-11-05 MED ORDER — PANTOPRAZOLE SODIUM 40 MG PO TBEC: 40.0000 mg | DELAYED_RELEASE_TABLET | Freq: Two times a day (BID) | ORAL | Status: AC

## 2014-11-05 NOTE — Progress Notes (Signed)
Patient is discharged from room 5M06 at this time. Alert and in stable condition. IV site d/c'd. Instructions read to patient and understanding verbalized.

## 2014-11-05 NOTE — Discharge Summary (Signed)
Physician Discharge Summary   Patient ID: Hector Finley MRN: [id number] DOB/AGE: 1940/02/27 74 y.o.  Admit date: 11/03/2014 Discharge date: 11/05/2014  Primary Care Physician:  No primary care provider on file.  Discharge Diagnoses:    Upper GI bleed  Large deeply catered 1 cm prepyloric ulcer  . UTI (lower urinary tract infection) . AKI (acute kidney injury) . GI bleed . CKD (chronic kidney disease) stage 4, GFR 15-29 ml/min  Consults: Gastroenterology, Dr Havery Moros   Recommendations for Outpatient Follow-up:  Patient was recommended PPI twice a day  He will have follow-up with gastroenterology in 4 weeks, will likely need repeat endoscopy to follow up on the ulcer  Patient was strongly advised to quit Mobic, any NSAIDs  TESTS THAT NEED FOLLOW-UP CBC, BMET  Urine culture results   DIET: *Heart healthy diet    Allergies:  No Known Allergies   Discharge Medications:   Medication List    STOP taking these medications        benazepril-hydrochlorthiazide 10-12.5 MG per tablet  Commonly known as:  LOTENSIN HCT     ibuprofen 200 MG tablet  Commonly known as:  ADVIL,MOTRIN     lisinopril-hydrochlorothiazide 10-12.5 MG per tablet  Commonly known as:  PRINZIDE,ZESTORETIC     meloxicam 15 MG tablet  Commonly known as:  MOBIC     omeprazole 20 MG capsule  Commonly known as:  PRILOSEC      TAKE these medications        allopurinol 100 MG tablet  Commonly known as:  ZYLOPRIM  Take 100 mg by mouth daily.     amLODipine 10 MG tablet  Commonly known as:  NORVASC  Take 5 mg by mouth daily.     atorvastatin 80 MG tablet  Commonly known as:  LIPITOR  Take 40 mg by mouth daily at 6 PM.     calcium carbonate 500 MG chewable tablet  Commonly known as:  TUMS - dosed in mg elemental calcium  Chew 1 tablet by mouth daily as needed for indigestion or heartburn.     cephALEXin 500 MG capsule  Commonly known as:  KEFLEX  Take 1 capsule (500 mg total) by mouth  2 (two) times daily. x6 days     diazepam 10 MG tablet  Commonly known as:  VALIUM  Take 10 mg by mouth every 6 (six) hours as needed for anxiety.     fenofibrate 48 MG tablet  Commonly known as:  TRICOR  Take 48 mg by mouth daily.     loperamide 2 MG capsule  Commonly known as:  IMODIUM  Take 4 mg by mouth as needed for diarrhea or loose stools.     pantoprazole 40 MG tablet  Commonly known as:  PROTONIX  Take 1 tablet (40 mg total) by mouth 2 (two) times daily before a meal.     traMADol 50 MG tablet  Commonly known as:  ULTRAM  Take 1 tablet (50 mg total) by mouth every 6 (six) hours as needed for moderate pain or severe pain.     traZODone 50 MG tablet  Commonly known as:  DESYREL  Take 25 mg by mouth at bedtime as needed for sleep.         Brief H and P: For complete details please refer to admission H and P, but in brief Hector Finley is a 74 y.o. male with a past medical history significant for suspected dementia, gout, chronic renal failure stage unknown, hypertension  and hyperlipidemia who presented with back pain and black stools. Patient described describes few weeks of increasing weakness, followed by black hard stools today, that led him to present to [place]. The patient is chronically constipated, with a stool about every four days.On the day of admission he gave himself an enema which he periodically does and had small hard black stools without any fresh blood. In the ED, he had a normal lumbar x-ray, heme-positive stool, and mild anemia. He was hemodynamically stable and transferred to [facility]. Of note, he takes meloxicam daily for joint pain, and denies any previous history of UGIB. He does not drink alcohol. The patient also reported loss of appetite for about 4-5 days and stated that normally his weight is 184 lbs and was 163 lbs on admission. Patient was admitted for further workup.    Hospital Course:   GI bleed likely upper in the setting  of NSAID use daily, FOBT positive  Patient was placed on clear liquid diet, Protonix, gentle hydration. Gastroenterology was consulted. Patient had a negative colonoscopy via the [place] 2 years ago and was normal per the patient. Patient underwent EGD which showed large deeply cratered ulcer superior to the pylorus with a large flat pigmented spot but no visible vessel or other high risk stigmata of bleeding. Erythematous gastritis. Normal esophagus and duodenum. The patient was placed on PPI twice a day. He will have follow-up with gastroenterology in 1 month for repeat endoscopy. Hemoglobin remained stable, 13.3 at the time of discharge.   UTI (lower urinary tract infection) ? Pyuria -He was placed on empiric Rocephin. He self catheterizes. Patient was recommended clean supplies for the self catheterizations. Urine culture is pending, patient did not want to wait for the urine culture results. He was given the prescription for Keflex.     AKI (acute kidney injury), CKD (chronic kidney disease) stage 4, GFR 15-29 ml/min - Baseline creatinine function unknown, presented with creatinine of 2.1. Patient was placed on gentle hydration. NSAIDs, Mobic were discontinued. Lisinopril, HCTZ was placed on hold. Creatinine has improved to 1.4 at the time of discharge.   Hypertension - Currently stable, hold benazepril, HCTZ due to acute renal insufficiency. Continue Norvasc  Hyperlipidemia Continue statin  Osteoarthritis/ DJD - Patient was strongly recommended to not use NSAIDs. He was given a prescription for tramadol.   Day of Discharge BP 146/66 mmHg  Pulse 60  Temp(Src) 97.7 F (36.5 C) (Oral)  Resp 18  Ht 5\' 9"  (1.753 m)  Wt 73.936 kg (163 lb)  BMI 24.06 kg/m2  SpO2 100%  Physical Exam: General: Alert and awake oriented x3 not in any acute distress. HEENT: anicteric sclera, pupils reactive to light and accommodation CVS: S1-S2 clear no murmur rubs or gallops Chest:  clear to auscultation bilaterally, no wheezing rales or rhonchi Abdomen: soft nontender, nondistended, normal bowel sounds Extremities: no cyanosis, clubbing or edema noted bilaterally Neuro: Cranial nerves II-XII intact, no focal neurological deficits   The results of significant diagnostics from this hospitalization (including imaging, microbiology, ancillary and laboratory) are listed below for reference.    LAB RESULTS: Basic Metabolic Panel:  Recent Labs Lab 11/04/14 0600 11/05/14 0806  NA 141 140  K 3.7 3.9  CL 108 108  CO2 26 22  GLUCOSE 101* 118*  BUN 34* 22*  CREATININE 1.74* 1.45*  CALCIUM 9.1 9.5   Liver Function Tests: No results for input(s): AST, ALT, ALKPHOS, BILITOT, PROT, ALBUMIN in the last 168 hours.  No results for input(s): LIPASE, AMYLASE in the last 168 hours. No results for input(s): AMMONIA in the last 168 hours. CBC:  Recent Labs Lab 11/03/14 1540 11/04/14 0600  11/04/14 2235 11/05/14 0806  WBC 8.9 9.5  --   --   --   NEUTROABS 5.7  --   --   --   --   HGB 12.2* 10.8*  < > 11.1* 13.3  HCT 38.7* 33.4*  < > 34.5* 41.6  MCV 91.5 90.8  --   --   --   PLT 241 210  --   --   --   < > = values in this interval not displayed. Cardiac Enzymes: No results for input(s): CKTOTAL, CKMB, CKMBINDEX, TROPONINI in the last 168 hours. BNP: Invalid input(s): POCBNP CBG: No results for input(s): GLUCAP in the last 168 hours.  Significant Diagnostic Studies:  Dg Lumbar Spine Complete  11/03/2014   CLINICAL DATA:  Lumbago with radicular symptoms bilaterally. No recent trauma.  EXAM: LUMBAR SPINE - COMPLETE 4+ VIEW  COMPARISON:  None.  FINDINGS: Frontal, lateral, spot lumbosacral lateral, and bilateral oblique views were obtained. There are 5 non-rib-bearing lumbar type vertebral bodies. There is lower lumbar levoscoliosis. There is no fracture or spondylolisthesis. There is moderately severe disc space narrowing at L4-5. There is slightly less narrowing at  T12-L1, L3-4, and L5-S1. There is facet osteoarthritic change at L3-4, L4-5, and L5-S1 bilaterally. There is atherosclerotic calcification in the aorta.  IMPRESSION: Multilevel osteoarthritic change. Scoliosis. No fracture or spondylolisthesis.   Electronically Signed   By: Lowella Grip III M.D.   On: 11/03/2014 16:15    2D ECHO:   Disposition and Follow-up:     Discharge Instructions    Diet - low sodium heart healthy    Complete by:  As directed      Discharge instructions    Complete by:  As directed   Please DONOT TAKE mobic, ibuprofen, alleve for pain.  Please note Lisinopril/HCTZ is on HOLD until okay to restart per your regular PCP, Dr Sherral Hammers instructions     Increase activity slowly    Complete by:  As directed             DISPOSITION home   DISCHARGE FOLLOW-UP Follow-up Information    Follow up with Manus Gunning, MD. Schedule an appointment as soon as possible for a visit in 4 weeks.   Specialty:  Gastroenterology   Why:  for hospital follow-up   Contact information:   [address] [place] [address] [phone]       Schedule an appointment as soon as possible for a visit in 2 weeks to follow up.   Why:  for hospital follow-up   Contact information:   Please follow with your PCP at [facility]        Time spent on Discharge: 35 minutes  Signed:   [name],RIPUDEEP M.D. [facility] 11/05/2014, 10:36 AM Pager: [phone]

## 2014-11-05 NOTE — Progress Notes (Signed)
Progress Note   Subjective  Patient did well overnight. Had one dark BM last night but a nonblack BM this AM. Tolerated liquids yesterday and regular diet this AM. No abdominal pain. HE says he feels great.    Objective   Vital signs in last 24 hours: Temp:  [97.9 F (36.6 C)-98.7 F (37.1 C)] 97.9 F (36.6 C) (09/17 0611) Pulse Rate:  [51-63] 56 (09/17 0611) Resp:  [14-20] 18 (09/17 0611) BP: (107-156)/(39-71) 150/67 mmHg (09/17 0611) SpO2:  [93 %-98 %] 96 % (09/17 0611) Last BM Date: 11/04/14 General:    white male in NAD Heart:  Regular rate and rhythm; no murmurs Lungs: Respirations even and unlabored, lungs CTA bilaterally Abdomen:  Soft, nontender and nondistended. Normal bowel sounds. Extremities:  Without edema. Neurologic:  Alert and oriented,  grossly normal neurologically. Psych:  Cooperative. Normal mood and affect.  Intake/Output from previous day: 09/16 0701 - 09/17 0700 In: 290 [P.O.:240; I.V.:50] Out: 1575 [Urine:1575] Intake/Output this shift:    Lab Results:  Recent Labs  11/03/14 1540 11/04/14 0600 11/04/14 1520 11/04/14 2235  WBC 8.9 9.5  --   --   HGB 12.2* 10.8* 12.4* 11.1*  HCT 38.7* 33.4* 38.4* 34.5*  PLT 241 210  --   --    BMET  Recent Labs  11/03/14 1540 11/04/14 0600  NA 141 141  K 3.8 3.7  CL 105 108  CO2 26 26  GLUCOSE 101* 101*  BUN 44* 34*  CREATININE 2.10* 1.74*  CALCIUM 10.0 9.1   LFT No results for input(s): PROT, ALBUMIN, AST, ALT, ALKPHOS, BILITOT, BILIDIR, IBILI in the last 72 hours. PT/INR No results for input(s): LABPROT, INR in the last 72 hours.  Studies/Results: Dg Lumbar Spine Complete  11/03/2014   CLINICAL DATA:  Lumbago with radicular symptoms bilaterally. No recent trauma.  EXAM: LUMBAR SPINE - COMPLETE 4+ VIEW  COMPARISON:  None.  FINDINGS: Frontal, lateral, spot lumbosacral lateral, and bilateral oblique views were obtained. There are 5 non-rib-bearing lumbar type vertebral bodies. There is  lower lumbar levoscoliosis. There is no fracture or spondylolisthesis. There is moderately severe disc space narrowing at L4-5. There is slightly less narrowing at T12-L1, L3-4, and L5-S1. There is facet osteoarthritic change at L3-4, L4-5, and L5-S1 bilaterally. There is atherosclerotic calcification in the aorta.  IMPRESSION: Multilevel osteoarthritic change. Scoliosis. No fracture or spondylolisthesis.   Electronically Signed   By: Lowella Grip III M.D.   On: 11/03/2014 16:15       Assessment / Plan:   74 y/o male presenting with anemia and melena in the setting of daily NSAID use. EGD yesterday showed deeply cratered ulcer in the antrum with a large flat pigmented spot, no endoscopic therapy was performed. No active bleeding during the exam. He has done well overnight, likely passed old blood with BM, as last BM was much clearer. Now tolerating regular diet. Would repeat CBC to ensure stable today but otherwise think he is stable for discharge later today.   I suspect he likely had NSAID related ulcer. He brought in home meds and was taking Mobic daily and Advil PM every night. I counseled him on the risks of NSAIDs and recommend he avoid them completely and use tylenol PRN for pain. Recommend he take protonix 40mg  BID on discharge and he will see me in clinic in about a month for reassessment. At that time I will schedule him for endoscopy to ensure healing of the ulcer. Otherwise,  I will call him for pathology results and treat if H pylori positive.   He agreed with the plan and verbalized understanding.   Kerman Cellar, MD [facility] Pager [phone]

## 2014-11-05 NOTE — Progress Notes (Signed)
Patient left unit via wheelchair with all belongings and family at side.

## 2014-11-07 ENCOUNTER — Encounter: Payer: Self-pay | Admitting: *Deleted

## 2014-11-07 ENCOUNTER — Encounter (HOSPITAL_COMMUNITY): Payer: Self-pay | Admitting: Gastroenterology

## 2014-11-07 LAB — URINE CULTURE: Culture: >=100000

## 2014-11-09 ENCOUNTER — Telehealth: Payer: Self-pay | Admitting: Gastroenterology

## 2014-11-10 NOTE — Telephone Encounter (Signed)
See results note. 

## 2014-12-16 ENCOUNTER — Ambulatory Visit: Payer: Non-veteran care | Admitting: Gastroenterology

## 2016-05-12 IMAGING — CR DG LUMBAR SPINE COMPLETE 4+V
5 series · 5 of 5 positions shown · non-contrast
Comparison: None.

CLINICAL DATA: Lumbago with radicular symptoms bilaterally. No
recent trauma.

EXAM:
LUMBAR SPINE - COMPLETE 4+ VIEW

[t l-spine a.p.]
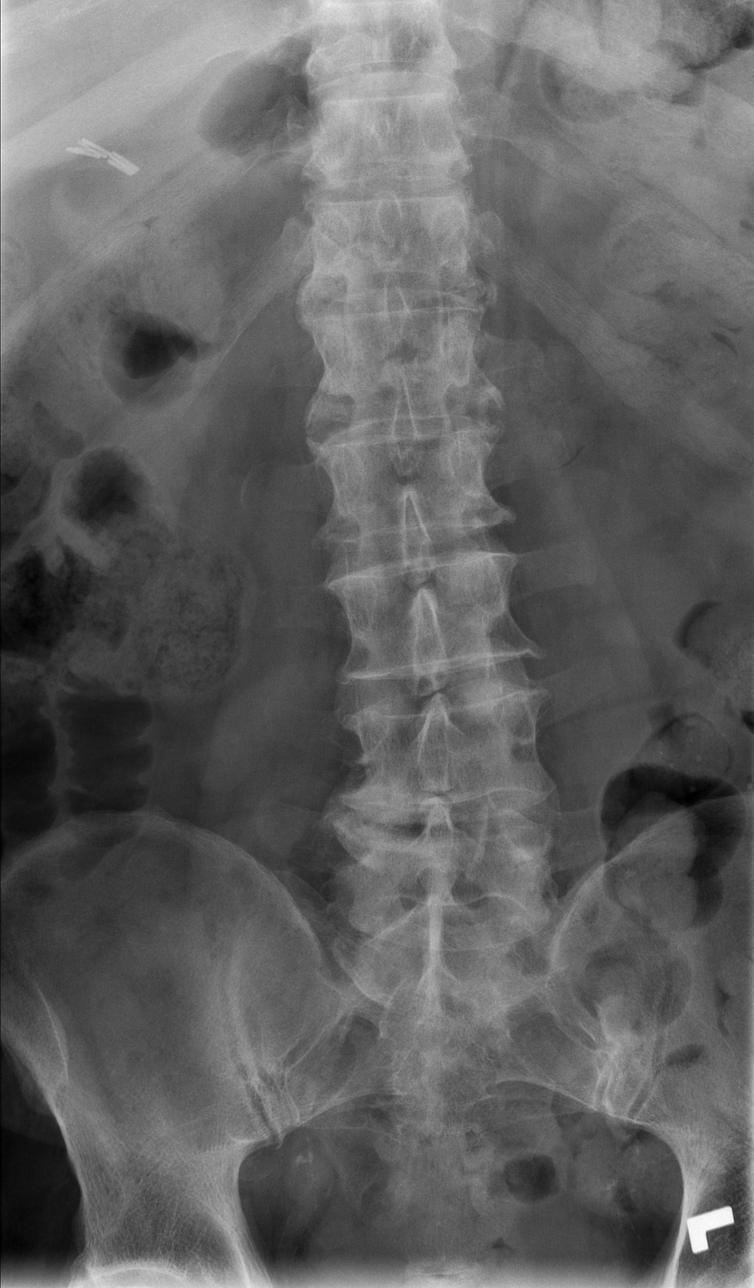

[t l-spine oblique exposure (1 of 2)]
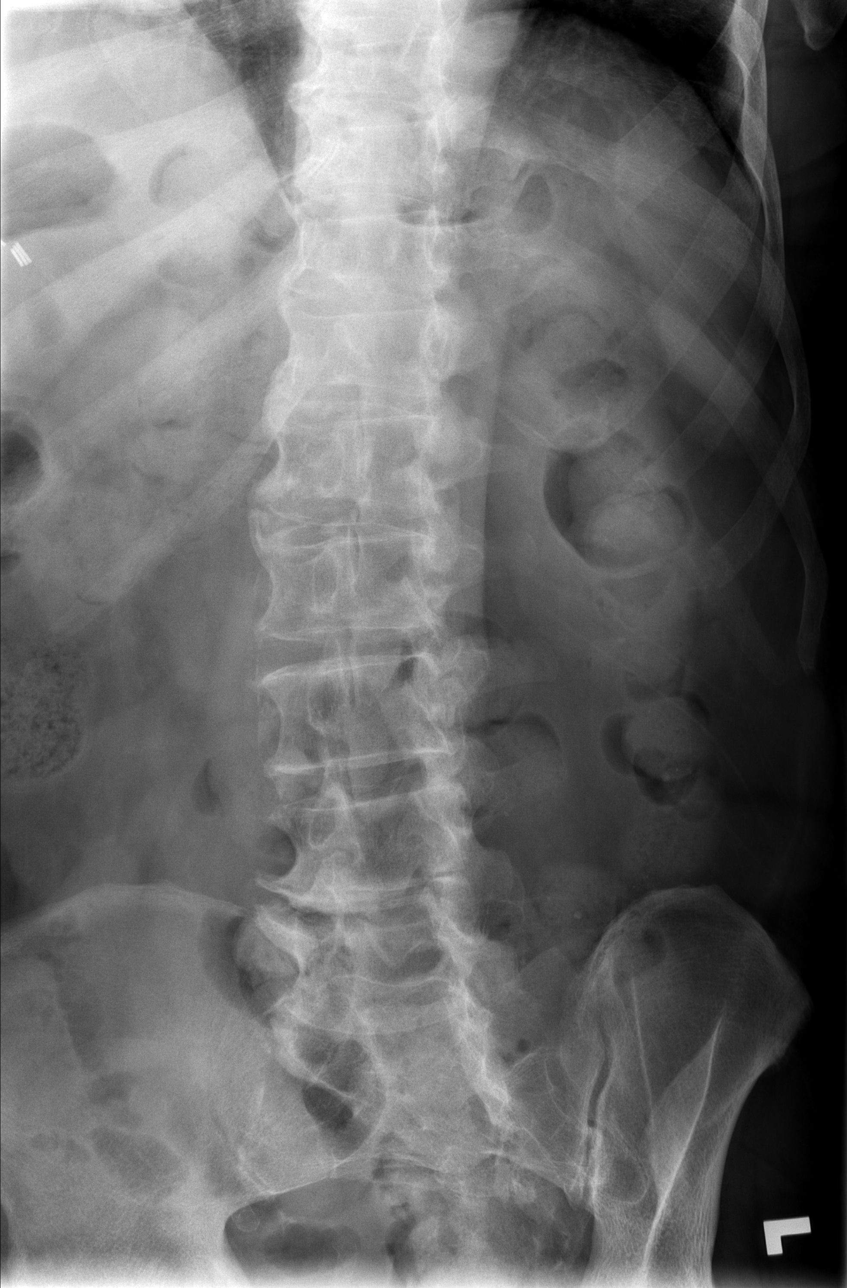

[t l-spine oblique exposure (2 of 2)]
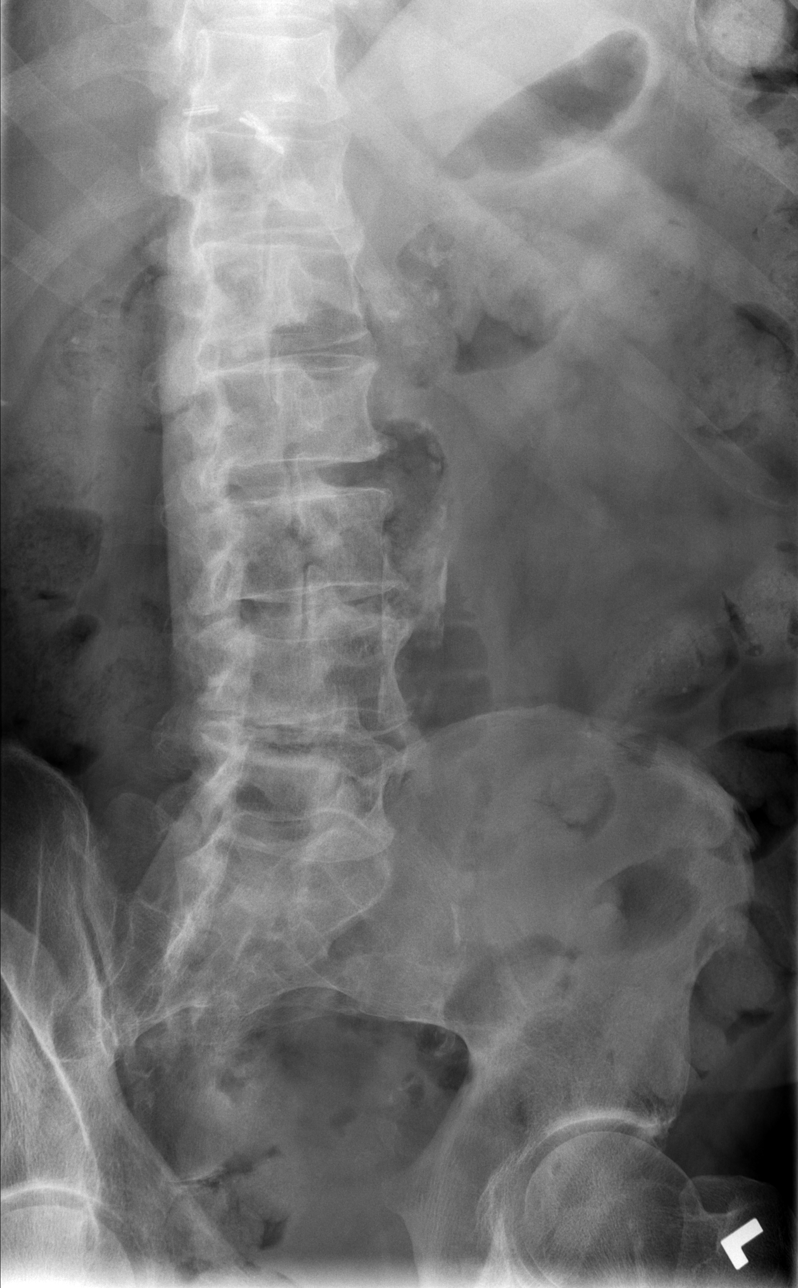

[t l-spine lat]
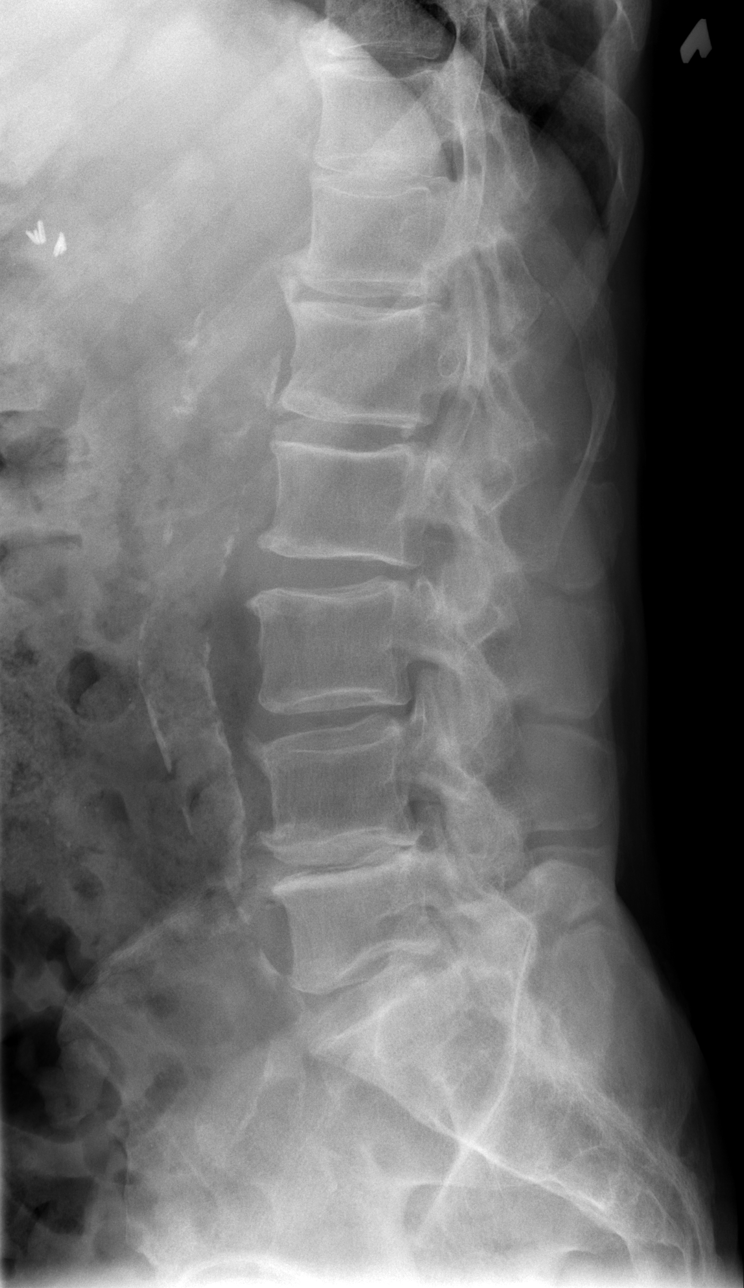

[t l-spine l5-s1 spot]
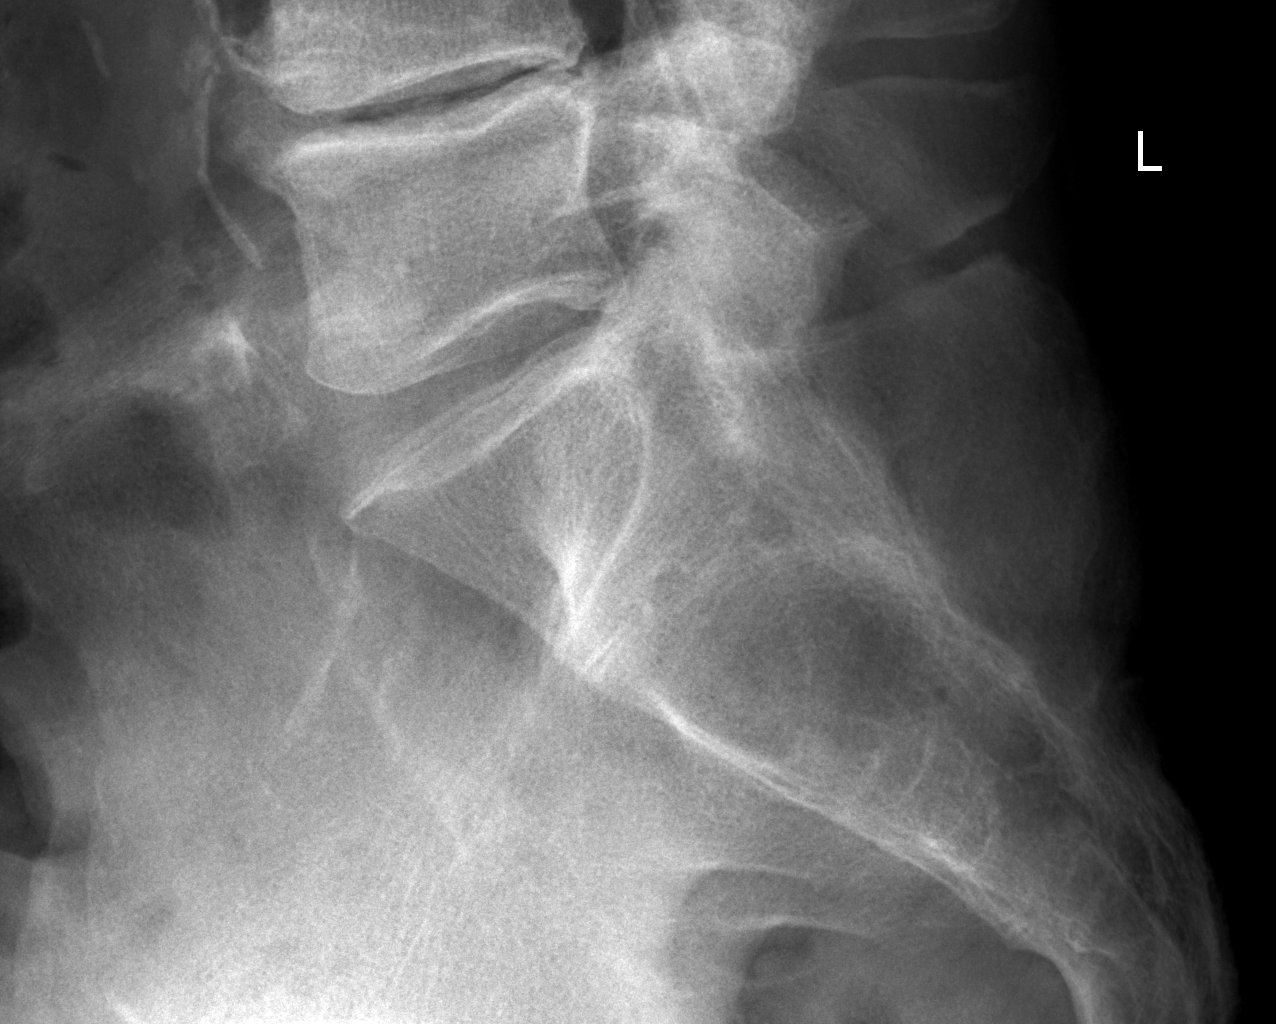

[5 of 5 positions shown; findings below may reference images not displayed]

FINDINGS: Frontal, lateral, spot lumbosacral lateral, and bilateral oblique
views were obtained. There are 5 non-rib-bearing lumbar type
vertebral bodies. There is lower lumbar levoscoliosis. There is no
fracture or spondylolisthesis. There is moderately severe disc space
narrowing at L4-5. There is slightly less narrowing at T12-L1, L3-4,
and L5-S1. There is facet osteoarthritic change at L3-4, L4-5, and
L5-S1 bilaterally. There is atherosclerotic calcification in the
aorta.
IMPRESSION: Multilevel osteoarthritic change. Scoliosis. No fracture or
spondylolisthesis.

## 2020-10-28 ENCOUNTER — Other Ambulatory Visit: Payer: Self-pay

## 2020-10-28 ENCOUNTER — Emergency Department (HOSPITAL_BASED_OUTPATIENT_CLINIC_OR_DEPARTMENT_OTHER)
Admission: EM | Admit: 2020-10-28 | Discharge: 2020-10-28 | Disposition: A | Discharge status: Home / Self Care | Payer: No Typology Code available for payment source | Attending: Emergency Medicine | Admitting: Emergency Medicine

## 2020-10-28 DIAGNOSIS — N184 Chronic kidney disease, stage 4 (severe): Secondary | ICD-10-CM | POA: Insufficient documentation

## 2020-10-28 DIAGNOSIS — R21 Rash and other nonspecific skin eruption: Secondary | ICD-10-CM | POA: Insufficient documentation

## 2020-10-28 DIAGNOSIS — Z85828 Personal history of other malignant neoplasm of skin: Secondary | ICD-10-CM | POA: Diagnosis not present

## 2020-10-28 DIAGNOSIS — I129 Hypertensive chronic kidney disease with stage 1 through stage 4 chronic kidney disease, or unspecified chronic kidney disease: Secondary | ICD-10-CM | POA: Diagnosis not present

## 2020-10-28 DIAGNOSIS — Z79899 Other long term (current) drug therapy: Secondary | ICD-10-CM | POA: Diagnosis not present

## 2020-10-28 DIAGNOSIS — I2510 Atherosclerotic heart disease of native coronary artery without angina pectoris: Secondary | ICD-10-CM | POA: Insufficient documentation

## 2020-10-28 MED ORDER — HYDROXYZINE HCL 25 MG PO TABS
25.0000 mg | ORAL_TABLET | Freq: Once | ORAL | Status: AC
  Administered 2020-10-28: 25 mg via ORAL
  Filled 2020-10-28: qty 1

## 2020-10-28 MED ORDER — HYDROXYZINE HCL 25 MG PO TABS: 25.0000 mg | ORAL_TABLET | Freq: Three times a day (TID) | ORAL | 0 refills | Status: AC | PRN

## 2020-10-28 MED ORDER — CARRINGTON MOISTURE BARRIER EX CREA: TOPICAL_CREAM | CUTANEOUS | 0 refills | Status: AC | PRN

## 2020-10-28 NOTE — ED Triage Notes (Signed)
Arrived via [facility] with c/o of rash under chin and to legs x 1 week

## 2020-10-28 NOTE — ED Provider Notes (Signed)
[place] EMERGENCY DEPARTMENT Provider Note   CSN: [id number] Arrival date & time: 10/28/20  1109     History Chief Complaint  Patient presents with   Rash    Hector Finley is a 80 y.o. male.  The history is provided by the patient.  Rash Location:  Face Quality: dryness and itchiness   Severity:  Mild Onset quality:  Gradual Duration:  1 week Timing:  Constant Chronicity:  New Context: not medications, not plant contact and not sun exposure   Relieved by:  Nothing Worsened by:  Nothing Associated symptoms: no fever, no periorbital edema, no shortness of breath, no sore throat, no throat swelling and no tongue swelling       Past Medical History:  Diagnosis Date   Coronary artery disease    Depression with anxiety    GERD (gastroesophageal reflux disease)    Gout    HTN (hypertension)    Hypercholesterolemia    Skin cancer    Skin cancer    Urinary retention    Requires him to self catheterize.    Patient Active Problem List   Diagnosis Date Noted   UTI (lower urinary tract infection) 11/03/2014   AKI (acute kidney injury) ([place]) 11/03/2014   Bleeding gastrointestinal 11/03/2014   GI bleed 11/03/2014   CKD (chronic kidney disease) stage 4, GFR 15-29 ml/min ([place]) 11/03/2014    Past Surgical History:  Procedure Laterality Date   CATARACT EXTRACTION Right    At [facility]   ESOPHAGOGASTRODUODENOSCOPY N/A 11/04/2014   Procedure: ESOPHAGOGASTRODUODENOSCOPY (EGD);  Surgeon: Manus Gunning, MD;  Location: [facility];  Service: Gastroenterology;  Laterality: N/A;   LAPAROSCOPIC CHOLECYSTECTOMY     Possible umbilicus hernia repair   SKIN CANCER EXCISION         No family history on file.  Social History   Tobacco Use   Smoking status: Never  Substance Use Topics   Alcohol use: No   Drug use: No    Home Medications Prior to Admission medications   Medication Sig Start Date End Date Taking? Authorizing Provider  hydrOXYzine  (ATARAX/VISTARIL) 25 MG tablet Take 1 tablet (25 mg total) by mouth every 8 (eight) hours as needed for up to 20 doses for itching. 10/28/20  Yes Taetum Flewellen, DO  Skin Protectants, Misc. (EUCERIN) cream Apply topically as needed for wound care. 10/28/20  Yes Shianna Bally, DO  allopurinol (ZYLOPRIM) 100 MG tablet Take 100 mg by mouth daily.    [provider]  amLODipine (NORVASC) 10 MG tablet Take 5 mg by mouth daily.    [provider]  atorvastatin (LIPITOR) 80 MG tablet Take 40 mg by mouth daily at 6 PM.    [provider]  calcium carbonate (TUMS - DOSED IN MG ELEMENTAL CALCIUM) 500 MG chewable tablet Chew 1 tablet by mouth daily as needed for indigestion or heartburn.    [provider]  cephALEXin (KEFLEX) 500 MG capsule Take 1 capsule (500 mg total) by mouth 2 (two) times daily. x6 days 11/05/14   [name], [name], MD  diazepam (VALIUM) 10 MG tablet Take 10 mg by mouth every 6 (six) hours as needed for anxiety.    [provider]  fenofibrate (TRICOR) 48 MG tablet Take 48 mg by mouth daily.    [provider]  loperamide (IMODIUM) 2 MG capsule Take 4 mg by mouth as needed for diarrhea or loose stools.    [provider]  pantoprazole (PROTONIX) 40 MG  tablet Take 1 tablet (40 mg total) by mouth 2 (two) times daily before a meal. 11/05/14   [name], [name], MD  traMADol (ULTRAM) 50 MG tablet Take 1 tablet (50 mg total) by mouth every 6 (six) hours as needed for moderate pain or severe pain. 11/05/14   [name], [name], MD  traZODone (DESYREL) 50 MG tablet Take 25 mg by mouth at bedtime as needed for sleep.    [provider]    Allergies    Patient has no known allergies.  Review of Systems   Review of Systems  Constitutional:  Negative for fever.  HENT:  Negative for sore throat, trouble swallowing and voice change.   Respiratory:  Negative for cough and shortness of breath.   Skin:  Positive for rash. Negative for  color change, pallor and wound.   Physical Exam Updated Vital Signs BP (!) 186/80 (BP Location: Right Arm)   Pulse 62   Temp 98.4 F (36.9 C) (Oral)   Resp 16   SpO2 97%   Physical Exam Constitutional:      General: He is not in acute distress.    Appearance: He is not ill-appearing.  HENT:     Head: Normocephalic and atraumatic.     Nose: Nose normal. No congestion.     Mouth/Throat:     Mouth: Mucous membranes are moist.  Eyes:     Pupils: Pupils are equal, round, and reactive to light.  Skin:    Capillary Refill: Capillary refill takes less than 2 seconds.     Findings: Rash present.     Comments: Has some flaky dry skin to his chest wall and face but there are no obvious hives or major swelling  Neurological:     Mental Status: He is alert.    ED Results / Procedures / Treatments   Labs (all labs ordered are listed, but only abnormal results are displayed) Labs Reviewed - No data to display  EKG None  Radiology No results found.  Procedures Procedures   Medications Ordered in ED Medications  hydrOXYzine (ATARAX/VISTARIL) tablet 25 mg (has no administration in time range)    ED Course  I have reviewed the triage vital signs and the nursing notes.  Pertinent labs & imaging results that were available during my care of the patient were reviewed by me and considered in my medical decision making (see chart for details).    MDM Rules/Calculators/A&P                           KYI ROMANELLO is here for itchiness to his face.  Normal vitals except for mild high blood pressure.  No fever.  Has had itchiness and rash to his neck, face for the past week.  Overall it appears that he has some bad dry skin.  There is no involvement of any mucosal surfaces.  No new medications.  No new exposures.  Not sure if he was exposed to any sun or sunburn.  Overall we will prescribe Vistaril to help with itchiness.  Recommend Aquaphor/Eucerin to help with the dry skin.   Recommend follow-up with primary care doctor.  No concern for anaphylaxis or severe allergic reaction at this time.  Discharged in good condition.  This chart was dictated using voice recognition software.  Despite best efforts to proofread,  errors can occur which can change the documentation meaning.   Final Clinical Impression(s) / ED Diagnoses Final  diagnoses:  Rash    Rx / DC Orders ED Discharge Orders          Ordered    hydrOXYzine (ATARAX/VISTARIL) 25 MG tablet  Every 8 hours PRN        10/28/20 1119    Skin Protectants, Misc. (EUCERIN) cream  As needed        10/28/20 1119             Lennice Sites, DO 10/28/20 1124

## 2020-10-28 NOTE — ED Notes (Signed)
Hector Finley at [phone] re: transportation; they do not provide transportation on the weekend.

## 2020-10-28 NOTE — Discharge Instructions (Addendum)
Overall suspect that patient likely has some dry skin issues.  Please use Vistaril as needed for itching.  Please use Eucerin cream or Aquaphor cream as needed to his chest wall and face.

## 2020-10-28 NOTE — ED Triage Notes (Signed)
Arrived via [facility] with C/O rash to chest, neck and scalp X1 month. Pt states its itchy. Pt denies fever pain nausea or vomiting.

## 2022-09-04 ENCOUNTER — Other Ambulatory Visit: Payer: Self-pay | Admitting: Orthopaedic Surgery

## 2022-09-04 DIAGNOSIS — T148XXA Other injury of unspecified body region, initial encounter: Secondary | ICD-10-CM

## 2022-09-06 ENCOUNTER — Other Ambulatory Visit: Payer: Non-veteran care

## 2022-09-10 ENCOUNTER — Ambulatory Visit: Admission: RE | Admit: 2022-09-10 | Payer: Medicare Other | Source: Ambulatory Visit

## 2022-09-10 DIAGNOSIS — T148XXA Other injury of unspecified body region, initial encounter: Secondary | ICD-10-CM
